# Patient Record
Sex: Female | Born: 1945 | Race: Black or African American | Hispanic: No | Marital: Single | Smoking: Never smoker
Health system: Southern US, Community
[De-identification: ages and names within clinical notes are randomized; demographics above are authoritative.]

## PROBLEM LIST (undated history)

## (undated) DIAGNOSIS — I509 Heart failure, unspecified: Secondary | ICD-10-CM

## (undated) DIAGNOSIS — I4891 Unspecified atrial fibrillation: Secondary | ICD-10-CM

## (undated) DIAGNOSIS — E119 Type 2 diabetes mellitus without complications: Secondary | ICD-10-CM

## (undated) DIAGNOSIS — I2510 Atherosclerotic heart disease of native coronary artery without angina pectoris: Secondary | ICD-10-CM

## (undated) DIAGNOSIS — I10 Essential (primary) hypertension: Secondary | ICD-10-CM

## (undated) DIAGNOSIS — Z950 Presence of cardiac pacemaker: Secondary | ICD-10-CM

---

## 2011-11-11 HISTORY — PX: CARDIAC CATHETERIZATION: SHX172

## 2013-11-10 DIAGNOSIS — Z950 Presence of cardiac pacemaker: Secondary | ICD-10-CM

## 2013-11-10 HISTORY — DX: Presence of cardiac pacemaker: Z95.0

## 2017-01-10 ENCOUNTER — Encounter (HOSPITAL_COMMUNITY): Payer: Self-pay | Admitting: *Deleted

## 2017-01-10 ENCOUNTER — Observation Stay (HOSPITAL_COMMUNITY)
Admission: EM | Admit: 2017-01-10 | Discharge: 2017-01-10 | Disposition: A | Discharge status: Home / Self Care | Payer: Medicare Other | Attending: Family Medicine | Admitting: Family Medicine

## 2017-01-10 ENCOUNTER — Emergency Department (HOSPITAL_COMMUNITY): Payer: Medicare Other

## 2017-01-10 DIAGNOSIS — Z79899 Other long term (current) drug therapy: Secondary | ICD-10-CM | POA: Diagnosis not present

## 2017-01-10 DIAGNOSIS — I110 Hypertensive heart disease with heart failure: Secondary | ICD-10-CM | POA: Insufficient documentation

## 2017-01-10 DIAGNOSIS — M940 Chondrocostal junction syndrome [Tietze]: Secondary | ICD-10-CM | POA: Diagnosis present

## 2017-01-10 DIAGNOSIS — R079 Chest pain, unspecified: Secondary | ICD-10-CM | POA: Diagnosis not present

## 2017-01-10 DIAGNOSIS — H409 Unspecified glaucoma: Secondary | ICD-10-CM | POA: Diagnosis not present

## 2017-01-10 DIAGNOSIS — I2510 Atherosclerotic heart disease of native coronary artery without angina pectoris: Secondary | ICD-10-CM | POA: Diagnosis not present

## 2017-01-10 DIAGNOSIS — F17200 Nicotine dependence, unspecified, uncomplicated: Secondary | ICD-10-CM | POA: Insufficient documentation

## 2017-01-10 DIAGNOSIS — J45909 Unspecified asthma, uncomplicated: Secondary | ICD-10-CM | POA: Insufficient documentation

## 2017-01-10 DIAGNOSIS — Z7901 Long term (current) use of anticoagulants: Secondary | ICD-10-CM | POA: Insufficient documentation

## 2017-01-10 DIAGNOSIS — M7989 Other specified soft tissue disorders: Secondary | ICD-10-CM | POA: Insufficient documentation

## 2017-01-10 DIAGNOSIS — I509 Heart failure, unspecified: Secondary | ICD-10-CM | POA: Insufficient documentation

## 2017-01-10 DIAGNOSIS — R0600 Dyspnea, unspecified: Principal | ICD-10-CM | POA: Diagnosis present

## 2017-01-10 DIAGNOSIS — R7989 Other specified abnormal findings of blood chemistry: Secondary | ICD-10-CM | POA: Diagnosis not present

## 2017-01-10 DIAGNOSIS — E119 Type 2 diabetes mellitus without complications: Secondary | ICD-10-CM

## 2017-01-10 DIAGNOSIS — E785 Hyperlipidemia, unspecified: Secondary | ICD-10-CM | POA: Diagnosis not present

## 2017-01-10 DIAGNOSIS — I482 Chronic atrial fibrillation, unspecified: Secondary | ICD-10-CM

## 2017-01-10 DIAGNOSIS — I4891 Unspecified atrial fibrillation: Secondary | ICD-10-CM | POA: Diagnosis present

## 2017-01-10 DIAGNOSIS — Z950 Presence of cardiac pacemaker: Secondary | ICD-10-CM | POA: Diagnosis not present

## 2017-01-10 DIAGNOSIS — D649 Anemia, unspecified: Secondary | ICD-10-CM | POA: Insufficient documentation

## 2017-01-10 DIAGNOSIS — Z8701 Personal history of pneumonia (recurrent): Secondary | ICD-10-CM | POA: Diagnosis not present

## 2017-01-10 DIAGNOSIS — Z7982 Long term (current) use of aspirin: Secondary | ICD-10-CM | POA: Diagnosis not present

## 2017-01-10 DIAGNOSIS — Z7984 Long term (current) use of oral hypoglycemic drugs: Secondary | ICD-10-CM | POA: Insufficient documentation

## 2017-01-10 HISTORY — DX: Unspecified atrial fibrillation: I48.91

## 2017-01-10 HISTORY — DX: Type 2 diabetes mellitus without complications: E11.9

## 2017-01-10 HISTORY — DX: Atherosclerotic heart disease of native coronary artery without angina pectoris: I25.10

## 2017-01-10 HISTORY — DX: Heart failure, unspecified: I50.9

## 2017-01-10 HISTORY — DX: Presence of cardiac pacemaker: Z95.0

## 2017-01-10 LAB — BASIC METABOLIC PANEL
ANION GAP: 14 (ref 5–15)
BUN: 20 mg/dL (ref 6–20)
CALCIUM: 9.8 mg/dL (ref 8.9–10.3)
CO2: 23 mmol/L (ref 22–32)
Chloride: 102 mmol/L (ref 101–111)
Creatinine, Ser: 1.18 mg/dL — ABNORMAL HIGH (ref 0.44–1.00)
GFR, EST AFRICAN AMERICAN: 54 mL/min — AB (ref >60)
GFR, EST NON AFRICAN AMERICAN: 46 mL/min — AB (ref >60)
GLUCOSE: 169 mg/dL — AB (ref 65–99)
POTASSIUM: 4.2 mmol/L (ref 3.5–5.1)
Sodium: 139 mmol/L (ref 135–145)

## 2017-01-10 LAB — DIFFERENTIAL
BASOS ABS: 0 10*3/uL (ref 0.0–0.1)
BASOS PCT: 0 %
EOS ABS: 0.3 10*3/uL (ref 0.0–0.7)
Eosinophils Relative: 4 %
Lymphocytes Relative: 25 %
Lymphs Abs: 1.8 10*3/uL (ref 0.7–4.0)
Monocytes Absolute: 0.5 10*3/uL (ref 0.1–1.0)
Monocytes Relative: 6 %
NEUTROS ABS: 4.7 10*3/uL (ref 1.7–7.7)
NEUTROS PCT: 65 %

## 2017-01-10 LAB — CBC
HEMATOCRIT: 33.1 % — AB (ref 36.0–46.0)
HEMOGLOBIN: 11.1 g/dL — AB (ref 12.0–15.0)
MCH: 28.8 pg (ref 26.0–34.0)
MCHC: 33.5 g/dL (ref 30.0–36.0)
MCV: 85.8 fL (ref 78.0–100.0)
Platelets: 436 10*3/uL — ABNORMAL HIGH (ref 150–400)
RBC: 3.86 MIL/uL — AB (ref 3.87–5.11)
RDW: 15.8 % — ABNORMAL HIGH (ref 11.5–15.5)
WBC: 7.8 10*3/uL (ref 4.0–10.5)

## 2017-01-10 LAB — I-STAT TROPONIN, ED: TROPONIN I, POC: 0 ng/mL (ref 0.00–0.08)

## 2017-01-10 LAB — PROTIME-INR
INR: 1.95
PROTHROMBIN TIME: 22.6 s — AB (ref 11.4–15.2)

## 2017-01-10 LAB — TROPONIN I: Troponin I: <0.03 ng/mL (ref <0.03)

## 2017-01-10 LAB — BRAIN NATRIURETIC PEPTIDE: B NATRIURETIC PEPTIDE 5: 348.7 pg/mL — AB (ref 0.0–100.0)

## 2017-01-10 MED ORDER — FUROSEMIDE 10 MG/ML IJ SOLN
40.0000 mg | Freq: Once | INTRAMUSCULAR | Status: AC
  Administered 2017-01-10: 40 mg via INTRAVENOUS
  Filled 2017-01-10: qty 4

## 2017-01-10 MED ORDER — METHYLPREDNISOLONE SODIUM SUCC 125 MG IJ SOLR
125.0000 mg | Freq: Two times a day (BID) | INTRAMUSCULAR | Status: DC
  Administered 2017-01-10: 125 mg via INTRAVENOUS
  Filled 2017-01-10: qty 2

## 2017-01-10 MED ORDER — LATANOPROST 0.005 % OP SOLN: 1.0000 [drp] | Freq: Every day | OPHTHALMIC | Status: DC

## 2017-01-10 MED ORDER — GUAIFENESIN ER 600 MG PO TB12: 600.0000 mg | ORAL_TABLET | Freq: Two times a day (BID) | ORAL | 0 refills | Status: AC

## 2017-01-10 MED ORDER — LISINOPRIL 20 MG PO TABS
20.0000 mg | ORAL_TABLET | Freq: Two times a day (BID) | ORAL | Status: DC
  Filled 2017-01-10: qty 1

## 2017-01-10 MED ORDER — HYDROCODONE-ACETAMINOPHEN 5-325 MG PO TABS: 1.0000 | ORAL_TABLET | Freq: Four times a day (QID) | ORAL | 0 refills | Status: DC | PRN

## 2017-01-10 MED ORDER — ACETAMINOPHEN 325 MG PO TABS: 650.0000 mg | ORAL_TABLET | ORAL | Status: DC | PRN

## 2017-01-10 MED ORDER — METOPROLOL TARTRATE 25 MG PO TABS: 125.0000 mg | ORAL_TABLET | Freq: Two times a day (BID) | ORAL | Status: DC

## 2017-01-10 MED ORDER — GUAIFENESIN ER 600 MG PO TB12: 600.0000 mg | ORAL_TABLET | Freq: Two times a day (BID) | ORAL | Status: DC

## 2017-01-10 MED ORDER — HYDROMORPHONE HCL 2 MG/ML IJ SOLN
0.5000 mg | Freq: Once | INTRAMUSCULAR | Status: AC | PRN
  Administered 2017-01-10: 0.5 mg via INTRAVENOUS
  Filled 2017-01-10: qty 1

## 2017-01-10 MED ORDER — AZITHROMYCIN 500 MG PO TABS: 500.0000 mg | ORAL_TABLET | Freq: Every day | ORAL | 0 refills | Status: AC

## 2017-01-10 MED ORDER — ONDANSETRON HCL 4 MG/2ML IJ SOLN: 4.0000 mg | Freq: Four times a day (QID) | INTRAMUSCULAR | Status: DC | PRN

## 2017-01-10 MED ORDER — ASPIRIN 325 MG PO TABS
325.0000 mg | ORAL_TABLET | Freq: Every day | ORAL | Status: DC
  Filled 2017-01-10: qty 1

## 2017-01-10 MED ORDER — KETOROLAC TROMETHAMINE 30 MG/ML IJ SOLN
15.0000 mg | Freq: Once | INTRAMUSCULAR | Status: AC
  Administered 2017-01-10: 15 mg via INTRAVENOUS
  Filled 2017-01-10: qty 1

## 2017-01-10 MED ORDER — ATORVASTATIN CALCIUM 80 MG PO TABS: 80.0000 mg | ORAL_TABLET | Freq: Every day | ORAL | Status: DC

## 2017-01-10 MED ORDER — MORPHINE SULFATE (PF) 4 MG/ML IV SOLN: 2.0000 mg | INTRAVENOUS | Status: DC | PRN

## 2017-01-10 MED ORDER — ZOLPIDEM TARTRATE 5 MG PO TABS: 5.0000 mg | ORAL_TABLET | Freq: Every evening | ORAL | Status: DC | PRN

## 2017-01-10 MED ORDER — FUROSEMIDE 20 MG PO TABS: 20.0000 mg | ORAL_TABLET | Freq: Every day | ORAL | Status: DC

## 2017-01-10 MED ORDER — IPRATROPIUM-ALBUTEROL 0.5-2.5 (3) MG/3ML IN SOLN
3.0000 mL | Freq: Four times a day (QID) | RESPIRATORY_TRACT | Status: DC
  Administered 2017-01-10: 3 mL via RESPIRATORY_TRACT
  Filled 2017-01-10 (×3): qty 3

## 2017-01-10 MED ORDER — ALBUTEROL SULFATE HFA 108 (90 BASE) MCG/ACT IN AERS: 2.0000 | INHALATION_SPRAY | RESPIRATORY_TRACT | 0 refills | Status: AC | PRN

## 2017-01-10 MED ORDER — PREDNISONE 20 MG PO TABS: 40.0000 mg | ORAL_TABLET | Freq: Every day | ORAL | 0 refills | Status: AC

## 2017-01-10 MED ORDER — ENOXAPARIN SODIUM 40 MG/0.4ML ~~LOC~~ SOLN: 40.0000 mg | SUBCUTANEOUS | Status: DC

## 2017-01-10 MED ORDER — HYDRALAZINE HCL 20 MG/ML IJ SOLN: 10.0000 mg | Freq: Three times a day (TID) | INTRAMUSCULAR | Status: DC | PRN

## 2017-01-10 MED ORDER — INSULIN ASPART 100 UNIT/ML ~~LOC~~ SOLN: 0.0000 [IU] | Freq: Three times a day (TID) | SUBCUTANEOUS | Status: DC

## 2017-01-10 NOTE — ED Notes (Signed)
Walked patient to the bathroom patient did well stayed at 96 room air and went down as low as 92 room air

## 2017-01-10 NOTE — Discharge Instructions (Signed)
Take 77m of lasix tonight Take 437mdaily for the following two days Make sure to take a breathing treatment at least every 6 hours Follow-up with your heart doctor next Thursday as scheduled

## 2017-01-10 NOTE — H&P (Signed)
[facility] History and Physical  Kyri Shader XYI:[id number] DOB: [birth year] DOA: 01/10/2017  Referring physician:  PCP: No primary care provider on file.   Chief Complaint: "I feel short of breath."  HPI: Candice Mclean is a 71 y.o. female  as well as history of atrial fibrillation, coronary disease, diabetes who presents to the hospital with chief complaint shortness of breath. Though patient was recently discharged from-Hospital with a diagnosis of arm infection and pneumonia. Vision since a few days after leaving the hospital she began to fell short of breath. Patient with a cough. Cough productive. Patient denies any fevers, chills, nausea vomiting, diarrhea or rash. Patient states while in hospital she developed some swelling in her extremities. Patient had  echo that was normal at the OSH hospital. She took Lasix regularly prior to coming to the hospital and was discharged on Lasix. She was compliant with her medication.  [place]: Patient given Lasix and Toradol. Chest x-ray showed possible fluid. Hospitalist consult for admission.  Review of Systems:  As per HPI otherwise 10 point review of systems negative.    Past Medical History:  Diagnosis Date  . AF (atrial fibrillation) ([place])   . CHF (congestive heart failure) ([place])   . Coronary artery disease   . Diabetes mellitus without complication ([place])   . Pacemaker    History reviewed. No pertinent surgical history. Social History:  reports that she has been smoking.  She has never used smokeless tobacco. She reports that she does not drink alcohol. Her drug history is not on file.  Allergies  Allergen Reactions  . Versed [Midazolam] Hives  . Tylenol With Codeine #3 [Acetaminophen-Codeine] Palpitations    No family history on file.   Prior to Admission medications   Medication Sig Start Date End Date Taking? Authorizing Provider  albuterol (PROVENTIL HFA;VENTOLIN HFA) 108 (90 Base) MCG/ACT inhaler Inhale 1-2 puffs  into the lungs every 6 (six) hours as needed for wheezing or shortness of breath.   Yes Historical Provider, MD  aspirin EC 81 MG tablet Take 81 mg by mouth daily.   Yes Historical Provider, MD  atorvastatin (LIPITOR) 80 MG tablet Take 80 mg by mouth daily.   Yes Historical Provider, MD  doxepin (SINEQUAN) 10 MG capsule Take 10 mg by mouth 3 (three) times a week. Monday, Wednesday and Saturday   Yes Historical Provider, MD  furosemide (LASIX) 20 MG tablet Take 20 mg by mouth daily.   Yes Historical Provider, MD  latanoprost (XALATAN) 0.005 % ophthalmic solution Place 1 drop into both eyes at bedtime.   Yes Historical Provider, MD  lisinopril (PRINIVIL,ZESTRIL) 20 MG tablet Take 20 mg by mouth 2 (two) times daily.   Yes Historical Provider, MD  magnesium oxide (MAG-OX) 400 (241.3 Mg) MG tablet Take 400-800 mg by mouth 2 (two) times daily. Two tablets in the morning and 1 tablet in the evening   Yes Historical Provider, MD  metFORMIN (GLUCOPHAGE) 500 MG tablet Take 1,000 mg by mouth daily with breakfast.   Yes Historical Provider, MD  metoprolol tartrate (LOPRESSOR) 25 MG tablet Take 125 mg by mouth 2 (two) times daily.   Yes Historical Provider, MD  montelukast (SINGULAIR) 10 MG tablet Take 10 mg by mouth at bedtime.   Yes Historical Provider, MD  Potassium 99 MG TABS Take 1 tablet by mouth daily.   Yes Historical Provider, MD  warfarin (COUMADIN) 5 MG tablet Take 5 mg by mouth daily. Take 1 tablet (79m) daily except Thursday take 1.5  tablets  (7.39m).   Yes Historical Provider, MD   Physical Exam: Vitals:   01/10/17 0436 01/10/17 0530 01/10/17 0645 01/10/17 0715  BP: 164/88 150/93 (!) 190/117 (!) 175/114  Pulse: 63 78 75 74  Resp: _0 Temp: 98.4 F (36.9 C)     TempSrc: Oral     SpO2: 99% 98% 96% 97%    Wt Readings from Last 3 Encounters:  No data found for Wt    General:  Appears calm and comfortable, Alert and oriented 3 Eyes:  PERRL, EOMI, normal lids, iris ENT:  grossly  normal hearing, lips & tongue Neck:  no LAD, masses or thyromegaly Cardiovascular:  Regular irregular, no m/r/g. No LE edema.  Respiratory:  Decreased air movement, tachypnea, no rales or rhonchi, chest wall tenderness on the right to palpation Abdomen:  soft, ntnd Skin:  no rash or induration seen on limited exam Musculoskeletal:  grossly normal tone BUE/BLE; limited range of motion of right hand to make a fist Psychiatric:  grossly normal mood and affect, speech fluent and appropriate Neurologic:  CN 2-12 grossly intact, moves all extremities in coordinated fashion.          Labs on Admission:  Basic Metabolic Panel:  Recent Labs Lab 01/10/17 0442  NA 139  K 4.2  CL 102  CO2 23  GLUCOSE 169*  BUN 20  CREATININE 1.18*  CALCIUM 9.8   Liver Function Tests: No results for input(s): AST, ALT, ALKPHOS, BILITOT, PROT, ALBUMIN in the last 168 hours. No results for input(s): LIPASE, AMYLASE in the last 168 hours. No results for input(s): AMMONIA in the last 168 hours. CBC:  Recent Labs Lab 01/10/17 0442  WBC 7.8  NEUTROABS 4.7  HGB 11.1*  HCT 33.1*  MCV 85.8  PLT 436*   Cardiac Enzymes:  Recent Labs Lab 01/10/17 0442  TROPONINI <0.03    BNP (last 3 results)  Recent Labs  01/10/17 0442  BNP 348.7*    ProBNP (last 3 results) No results for input(s): PROBNP in the last 8760 hours.   Creatinine clearance cannot be calculated (Unknown ideal weight.)  CBG: No results for input(s): GLUCAP in the last 168 hours.  Radiological Exams on Admission: Dg Chest 2 View  Result Date: 01/10/2017 CLINICAL DATA:  Acute onset of shortness of breath. Initial encounter. EXAM: CHEST  2 VIEW COMPARISON:  None. FINDINGS: The lungs are well-aerated. Vascular congestion is noted. Increased interstitial markings raise concern for mild pulmonary edema. There is no evidence of pleural effusion or pneumothorax. The heart is mildly enlarged. A pacemaker is noted at the left chest wall,  with leads ending at the right atrium and right ventricle. No acute osseous abnormalities are seen. IMPRESSION: Vascular congestion and mild cardiomegaly. Increased interstitial markings raise concern for mild pulmonary edema. Electronically Signed   By: JGarald BaldingM.D.   On: 01/10/2017 06:01    EKG: Independently reviewed. Afib, no acute St changes  Assessment/Plan Principal Problem:   Dyspnea Active Problems:   Diabetes (HCC)   Chest pain   Costochondritis   A-fib (HCC)   Dyspnea Wells score for PE of 0 Elevated BNP Chest x-ray read as possible fluid overload consider possible pneumonitis on exam little air movement These headaches were scheduled with ipsilateral. Epidural Steroids due to history of asthma and recent diagnosis of pneumonia Medical records ordered Nurses to ambulate patient  CP/costochondritis Given hear score of 3 in ED. - serial trop ordered, initial neg - prn  EKG CP - prn moprhine CP - prn ntg cp - asa in ED and QD - ECHO ordered for AM - tele bed, cardiac monitoring - ambien for sleep prn - zofran prn for nausea  DM SSI Hold metformin  Afib/CAD Cont lopressor 173m bid Cont coumadin  Hyperlipidemia Continue statin  Leg Swelling Cont lasix PO tomorrow  Hypertension When necessary hydralazine 10 mg IV as needed for severe blood pressure Cont lisinopril  Glaucoma Xalatan  Denies hx of CHF   Code Status: FULL DVT Prophylaxis: Lovenox Family Communication: Dgtr at bedside Disposition Plan: Pending Improvement  Status: Tele obs  PElwin Mocha MD Family Medicine [facility] [email or web address] Password [email or web address]

## 2017-01-10 NOTE — ED Notes (Signed)
Per Dr. Aggie Moats, patient being discharged.

## 2017-01-10 NOTE — ED Notes (Signed)
Pt transported to xray 

## 2017-01-10 NOTE — ED Triage Notes (Signed)
The pt is c/o shortness of breath since last week  She was discharged from a hospital in [place] on Monday.  She is here  Staying with her daughter.  Recent diagnosis of pneumonia.  She also has rt arm pain past infection there

## 2017-01-10 NOTE — ED Notes (Signed)
Pts daughter has question about Prednisone prescription.  Paged Dr. Aggie Moats, he will call prescription to pts pharmacy.  Advised family.

## 2017-01-10 NOTE — ED Notes (Signed)
Pt does not want to be admitted if she doesn't have to.  Declining meds unless she gets admitted.

## 2017-01-10 NOTE — ED Provider Notes (Signed)
[facility] DEPT Provider Note   CSN: [id number] Arrival date & time: 01/10/17  0424     History   Chief Complaint Chief Complaint  Patient presents with  . Shortness of Breath    HPI Candice Mclean is a 71 y.o. female.  She was discharged from a hospital in [facility] 5 days ago where she was admitted for pneumonia. She was also treated for fluid overload with furosemide. She is complaining of a throbbing pain in the right lower rib cage which has been present for the bout the last week. She rates pain at 7/10. It is worse with deep breathing. She has noted ongoing problems with dyspnea. There is been a very mild cough which is nonproductive. She denies fever, chills, sweats. She denies nausea or vomiting. She was not discharged with any pain medication, so she has not been taking anything stronger than acetaminophen.   The history is provided by the patient.    Past Medical History:  Diagnosis Date  . AF (atrial fibrillation) ([place])   . CHF (congestive heart failure) ([place])   . Coronary artery disease   . Diabetes mellitus without complication ([place])   . Pacemaker     There are no active problems to display for this patient.   History reviewed. No pertinent surgical history.  OB History    No data available       Home Medications    Prior to Admission medications   Not on File    Family History No family history on file.  Social History Social History  Substance Use Topics  . Smoking status: Current Every Day Smoker  . Smokeless tobacco: Never Used  . Alcohol use No     Allergies   Patient has no allergy information on record.   Review of Systems Review of Systems  All other systems reviewed and are negative.    Physical Exam Updated Vital Signs BP 164/88 (BP Location: Left Arm)   Pulse 63   Temp 98.4 F (36.9 C) (Oral)   Resp 22   SpO2 99%   Physical Exam  Nursing note and vitals reviewed.  71 year old female, Appears somewhat  dyspneic, but is in no acute distress. Vital signs are significant for tachypnea and hypertension. Oxygen saturation is 99%, which is normal. Head is normocephalic and atraumatic. PERRLA, EOMI. Oropharynx is clear. Neck is nontender and supple without adenopathy or JVD. Back is nontender and there is no CVA tenderness. Lungs are clear without rales, wheezes, or rhonchi. Chest has mild to moderate tenderness in the right lower rib cage. Heart has regular rate and rhythm without murmur. Abdomen is soft, flat, nontender without masses or hepatosplenomegaly and peristalsis is normoactive. Extremities have 1+ edema, full range of motion is present. Skin is warm and dry without rash. Neurologic: Mental status is normal, cranial nerves are intact, there are no motor or sensory deficits.  ED Treatments / Results  Labs (all labs ordered are listed, but only abnormal results are displayed) Labs Reviewed  BASIC METABOLIC PANEL - Abnormal; Notable for the following:       Result Value   Glucose, Bld 169 (*)    Creatinine, Ser 1.18 (*)    GFR calc non Af Amer 46 (*)    GFR calc Af Amer 54 (*)    All other components within normal limits  CBC - Abnormal; Notable for the following:    RBC 3.86 (*)    Hemoglobin 11.1 (*)  HCT 33.1 (*)    RDW 15.8 (*)    Platelets 436 (*)    All other components within normal limits  PROTIME-INR - Abnormal; Notable for the following:    Prothrombin Time 22.6 (*)    All other components within normal limits  BRAIN NATRIURETIC PEPTIDE - Abnormal; Notable for the following:    B Natriuretic Peptide 348.7 (*)    All other components within normal limits  TROPONIN I  DIFFERENTIAL  I-STAT TROPOININ, ED    EKG  EKG Interpretation  Date/Time:  Saturday January 10 2017 04:41:25 EST Ventricular Rate:  101 PR Interval:    QRS Duration: 74 QT Interval:  352 QTC Calculation: 456 R Axis:   29 Text Interpretation:  Atrial fibrillation with rapid ventricular  response with occasional ventricular-paced complexes and with premature ventricular or aberrantly conducted complexes Nonspecific T wave abnormality Abnormal ECG No old tracing to compare Confirmed by [facility]  MD, Blythe Veach ([id number]) on 01/10/2017 4:39:44 AM       Radiology Dg Chest 2 View  Result Date: 01/10/2017 CLINICAL DATA:  Acute onset of shortness of breath. Initial encounter. EXAM: CHEST  2 VIEW COMPARISON:  None. FINDINGS: The lungs are well-aerated. Vascular congestion is noted. Increased interstitial markings raise concern for mild pulmonary edema. There is no evidence of pleural effusion or pneumothorax. The heart is mildly enlarged. A pacemaker is noted at the left chest wall, with leads ending at the right atrium and right ventricle. No acute osseous abnormalities are seen. IMPRESSION: Vascular congestion and mild cardiomegaly. Increased interstitial markings raise concern for mild pulmonary edema. Electronically Signed   By: Garald Balding M.D.   On: 01/10/2017 06:01    Procedures Procedures (including critical care time)  Medications Ordered in ED Medications  ketorolac (TORADOL) 30 MG/ML injection 15 mg (not administered)     Initial Impression / Assessment and Plan / ED Course  I have reviewed the triage vital signs and the nursing notes.  Pertinent labs & imaging results that were available during my care of the patient were reviewed by me and considered in my medical decision making (see chart for details).  Dyspnea and chest pain in patient with the recent history of pneumonia. We'll check chest x-ray and also obtain a screening labs including BNP. No evidence of bronchospasm on exam, so she will not be given nebulizer treatments. She has no old records and the [facility] system.  Chest x-ray shows evidence of pulmonary vascular congestion and early pulmonary edema. BNP is significantly elevated. INR is minimally subtherapeutic. She is given a dose of furosemide but continues to  be dyspneic at rest. Cases discussed with Dr. Aggie Moats of [facility] who states he will come to evaluate the patient for possible admission.  CHA2DS2/VAS Stroke Risk Score = 5  (one point each for age, female sex, CHF history, Vascular disease history, diabetes history)     Final Clinical Impressions(s) / ED Diagnoses   Final diagnoses:  Acute on chronic congestive heart failure, unspecified congestive heart failure type ([place])  Atrial fibrillation, chronic (HCC)  Normochromic normocytic anemia    New Prescriptions New Prescriptions   No medications on file     Delora Fuel, MD 83/33/83 [id number]

## 2017-01-12 ENCOUNTER — Telehealth (HOSPITAL_COMMUNITY): Payer: Self-pay | Admitting: *Deleted

## 2017-01-12 NOTE — Telephone Encounter (Signed)
Pt on afib ED report.  Pt sees [facility] healthcare cardiologist and per note already has scheduled appt with their office.  Will not follow at this time.

## 2017-03-20 NOTE — Discharge Summary (Signed)
Physician Discharge Summary  Candice Mclean IWO:[id number] DOB: 1946/02/27 DOA: 01/10/2017  PCP: No primary care provider on file.  Admit date: 01/10/2017 Discharge date: 03/20/2017  Time spent: 30 minutes  Recommendations for Outpatient Follow-up:  1. f/u with PCP tomorrow if possible   Discharge Diagnoses:  Principal Problem:   Dyspnea Active Problems:   Diabetes ([place])   Chest pain   Costochondritis   A-fib ([place])   Discharge Condition: stable  Diet recommendation: Cardiac/DM    History of present illness:  Candice Mclean is a 71 y.o. female  as well as history of atrial fibrillation, coronary disease, diabetes who presents to the hospital with chief complaint shortness of breath. Though patient was recently discharged from-Hospital with a diagnosis of arm infection and pneumonia. Vision since a few days after leaving the hospital she began to fell short of breath. Patient with a cough. Cough productive. Patient denies any fevers, chills, nausea vomiting, diarrhea or rash. Patient states while in hospital she developed some swelling in her extremities. Patient had  echo that was normal at the OSH hospital. She took Lasix regularly prior to coming to the hospital and was discharged on Lasix. She was compliant with her medication.  [place]: Patient given Lasix and Toradol. Chest x-ray showed possible fluid. Hospitalist consult for admission.  Hospital Course:  While in ED. Pt anxious about admission. Better with a breathing treatment. Based on hx possible OP tx failure due to abx regimen and lack of resp meds. Pt [place]. Family and patient seem reliable. Pt was to return if any issues. Pt d/c from ED.    Discharge Exam: Vitals:   01/10/17 1200 01/10/17 1226  BP: 142/100   Pulse: 81   Resp: 18   Temp:  98.2 F (36.8 C)    General: NAD, A&Ox3 Cardiovascular: RRR, no MRG Respiratory: nl wob  Discharge Instructions   Discharge  Instructions    Call MD for:  difficulty breathing, headache or visual disturbances    Complete by:  As directed    Call MD for:  redness, tenderness, or signs of infection (pain, swelling, redness, odor or green/yellow discharge around incision site)    Complete by:  As directed    Diet - low sodium heart healthy    Complete by:  As directed    Diet - low sodium heart healthy    Complete by:  As directed    Discharge instructions    Complete by:  As directed    Print prepared   Driving Restrictions    Complete by:  As directed    7 days   Increase activity slowly    Complete by:  As directed    Increase activity slowly    Complete by:  As directed    Sexual Activity Restrictions    Complete by:  As directed    7 days     Discharge Medication List as of 01/10/2017 12:18 PM    START taking these medications   Details  azithromycin (ZITHROMAX) 500 MG tablet Take 1 tablet (500 mg total) by mouth daily., Starting Sat 01/10/2017, Until Thu 01/15/2017, Normal    guaiFENesin (MUCINEX) 600 MG 12 hr tablet Take 1 tablet (600 mg total) by mouth 2 (two) times daily., Starting Sat 01/10/2017, Until Sat 01/17/2017, Normal    HYDROcodone-acetaminophen (NORCO/VICODIN) 5-325 MG tablet Take 1 tablet by mouth every 6 (six) hours as needed for moderate pain., Starting Sat 01/10/2017, Print  CONTINUE these medications which have CHANGED   Details  albuterol (PROVENTIL HFA;VENTOLIN HFA) 108 (90 Base) MCG/ACT inhaler Inhale 2 puffs into the lungs every 2 (two) hours as needed for wheezing or shortness of breath., Starting Sat 01/10/2017, Normal      CONTINUE these medications which have NOT CHANGED   Details  aspirin EC 81 MG tablet Take 81 mg by mouth daily., Historical Med    atorvastatin (LIPITOR) 80 MG tablet Take 80 mg by mouth daily., Historical Med    doxepin (SINEQUAN) 10 MG capsule Take 10 mg by mouth 3 (three) times a week. Monday, Wednesday and Saturday, Historical Med    furosemide  (LASIX) 20 MG tablet Take 20 mg by mouth daily., Historical Med    latanoprost (XALATAN) 0.005 % ophthalmic solution Place 1 drop into both eyes at bedtime., Historical Med    lisinopril (PRINIVIL,ZESTRIL) 20 MG tablet Take 20 mg by mouth 2 (two) times daily., Historical Med    magnesium oxide (MAG-OX) 400 (241.3 Mg) MG tablet Take 400-800 mg by mouth 2 (two) times daily. Two tablets in the morning and 1 tablet in the evening, Historical Med    metFORMIN (GLUCOPHAGE) 500 MG tablet Take 1,000 mg by mouth daily with breakfast., Historical Med    metoprolol tartrate (LOPRESSOR) 25 MG tablet Take 125 mg by mouth 2 (two) times daily., Historical Med    montelukast (SINGULAIR) 10 MG tablet Take 10 mg by mouth at bedtime., Historical Med    warfarin (COUMADIN) 5 MG tablet Take 5 mg by mouth daily. Take 1 tablet (55m) daily except Thursday take 1.5 tablets  (7.520m., Historical Med      STOP taking these medications     Potassium 99 MG TABS        Allergies  Allergen Reactions  . Versed [Midazolam] Hives  . Tylenol With Codeine #3 [Acetaminophen-Codeine] Palpitations      The results of significant diagnostics from this hospitalization (including imaging, microbiology, ancillary and laboratory) are listed below for reference.    Significant Diagnostic Studies: No results found.  Microbiology: No results found for this or any previous visit (from the past 240 hour(s)).   Labs: Basic Metabolic Panel: No results for input(s): NA, K, CL, CO2, GLUCOSE, BUN, CREATININE, CALCIUM, MG, PHOS in the last 168 hours. Liver Function Tests: No results for input(s): AST, ALT, ALKPHOS, BILITOT, PROT, ALBUMIN in the last 168 hours. No results for input(s): LIPASE, AMYLASE in the last 168 hours. No results for input(s): AMMONIA in the last 168 hours. CBC: No results for input(s): WBC, NEUTROABS, HGB, HCT, MCV, PLT in the last 168 hours. Cardiac Enzymes: No results for input(s): CKTOTAL, CKMB,  CKMBINDEX, TROPONINI in the last 168 hours. BNP: BNP (last 3 results)  Recent Labs  01/10/17 0442  BNP 348.7*    ProBNP (last 3 results) No results for input(s): PROBNP in the last 8760 hours.  CBG: No results for input(s): GLUCAP in the last 168 hours.     Signed:  PhElwin MochaD  FACP  [facility] 03/20/2017, 10:22 AM

## 2018-05-07 IMAGING — DX DG CHEST 2V
2 series · 2 of 2 positions shown · non-contrast
Comparison: None.

CLINICAL DATA: Acute onset of shortness of breath. Initial
encounter.

EXAM:
CHEST  2 VIEW

[chest lat]
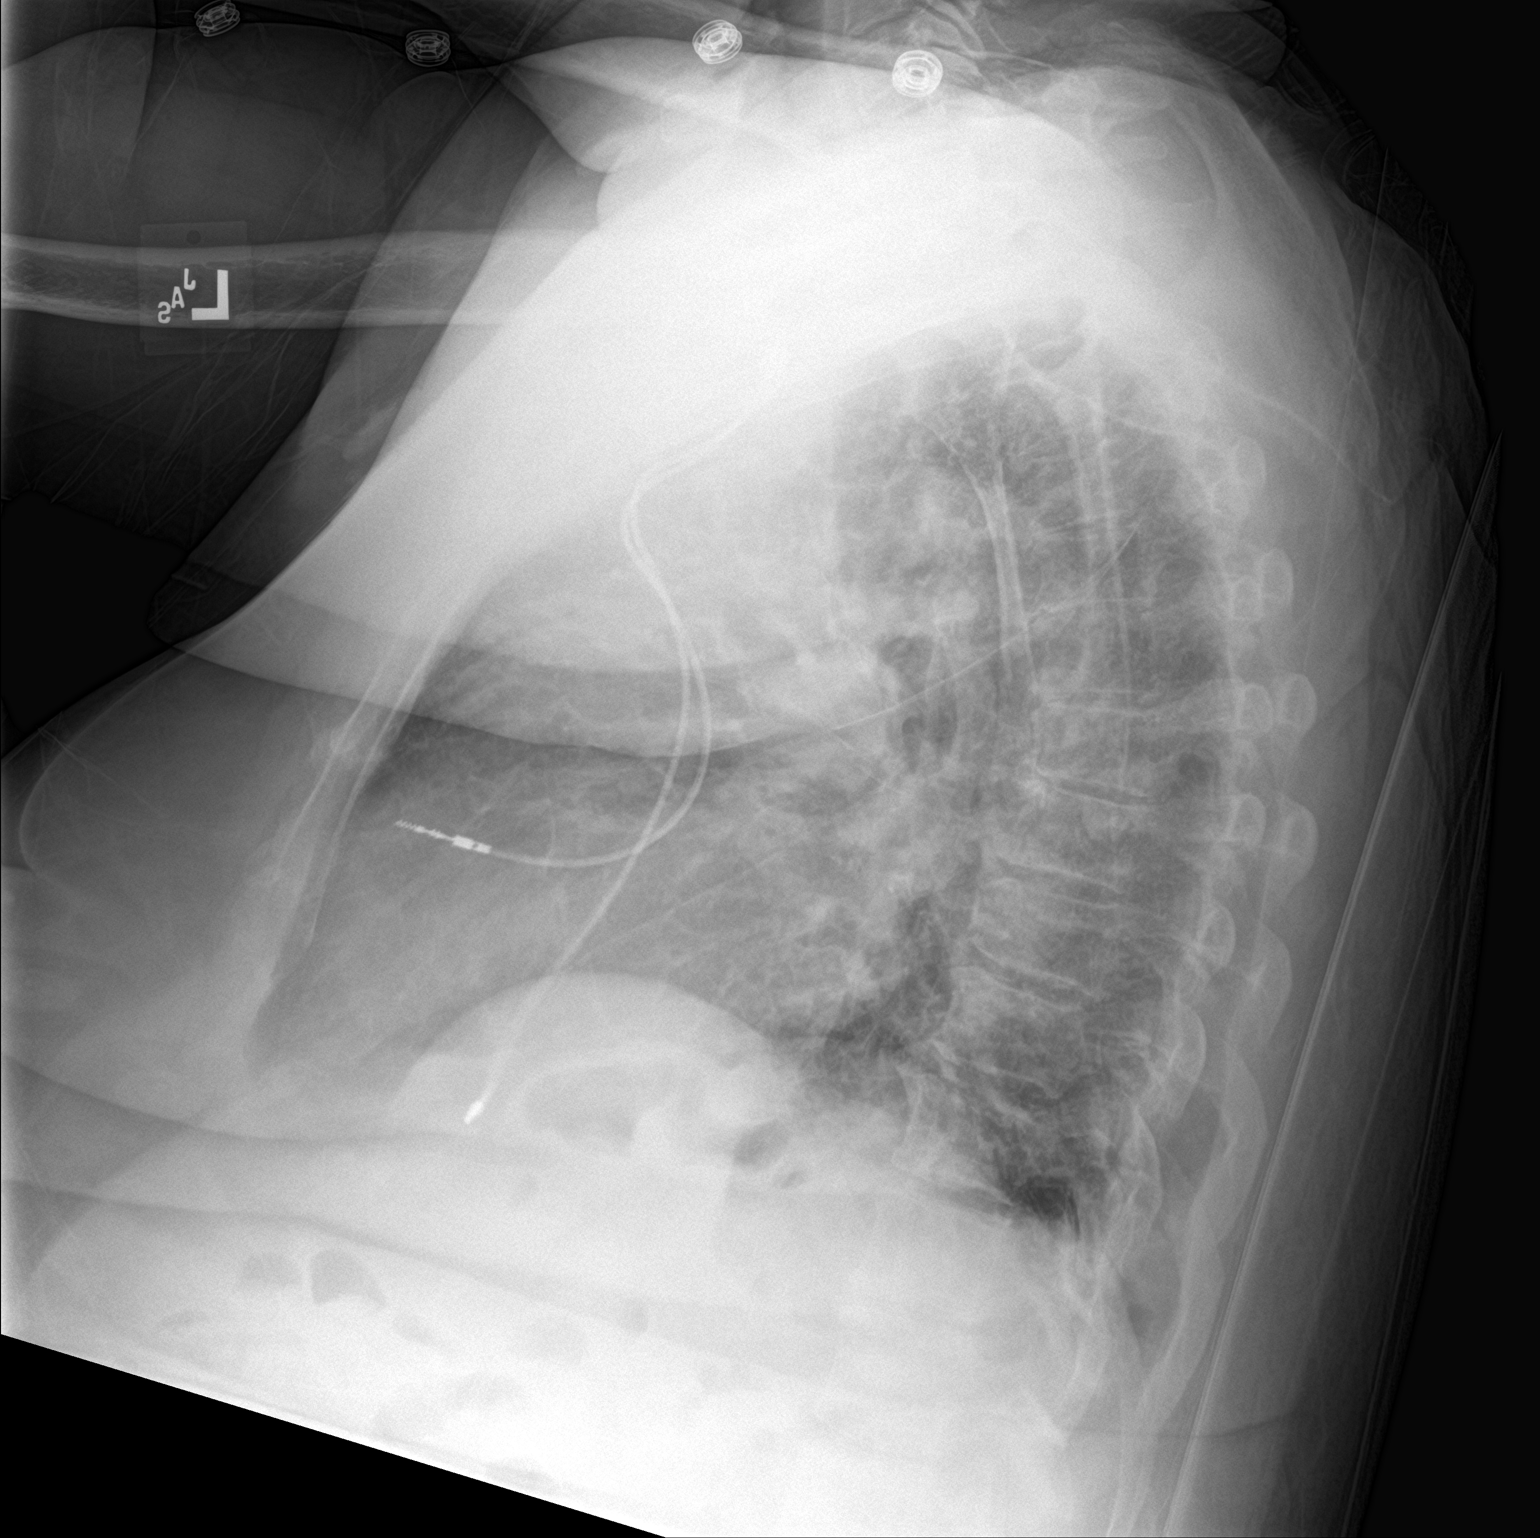

[chest ap]
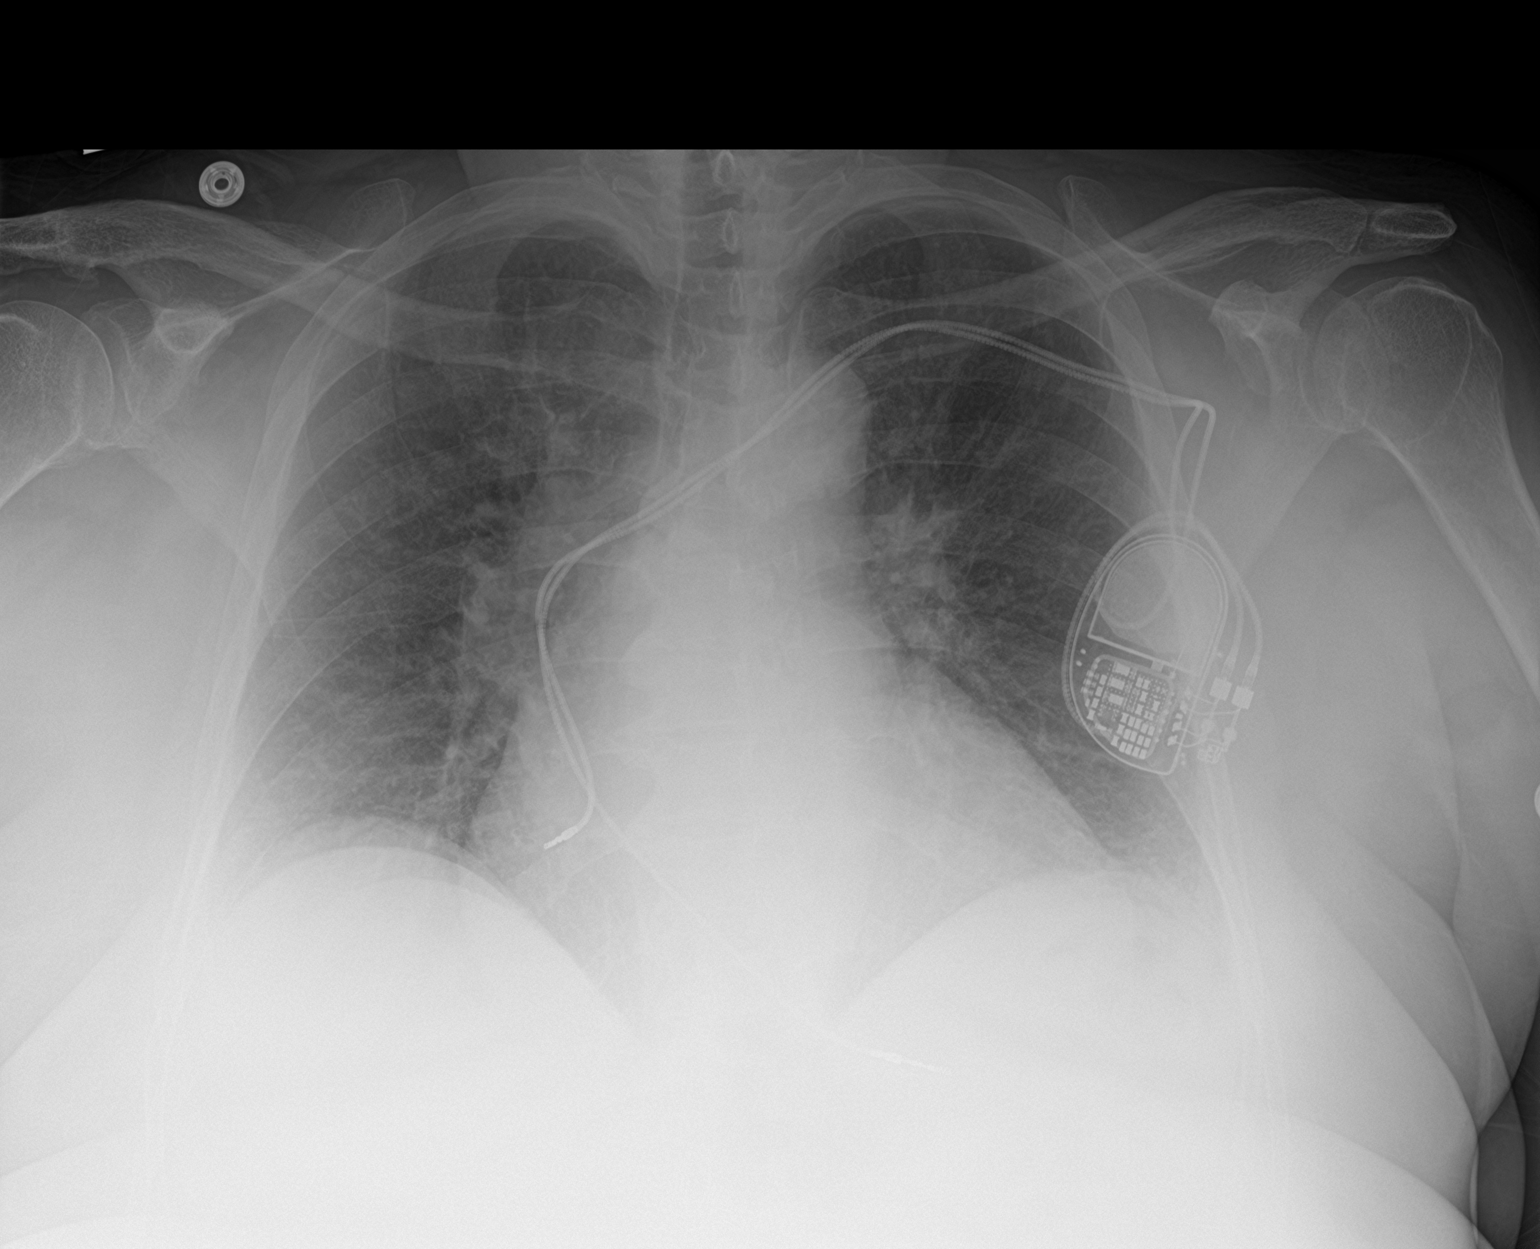

[2 of 2 positions shown; findings below may reference images not displayed]

FINDINGS: The lungs are well-aerated. Vascular congestion is noted. Increased
interstitial markings raise concern for mild pulmonary edema. There
is no evidence of pleural effusion or pneumothorax.

The heart is mildly enlarged. A pacemaker is noted at the left chest
wall, with leads ending at the right atrium and right ventricle. No
acute osseous abnormalities are seen.
IMPRESSION: Vascular congestion and mild cardiomegaly. Increased interstitial
markings raise concern for mild pulmonary edema.

## 2022-08-06 ENCOUNTER — Emergency Department (HOSPITAL_COMMUNITY): Payer: Medicare Other

## 2022-08-06 ENCOUNTER — Encounter (HOSPITAL_COMMUNITY): Payer: Self-pay | Admitting: Emergency Medicine

## 2022-08-06 ENCOUNTER — Inpatient Hospital Stay (HOSPITAL_COMMUNITY)
Admission: EM | Admit: 2022-08-06 | Discharge: 2022-08-18 | DRG: 193 | Disposition: A | Discharge status: Skilled Nursing Facility | Payer: Medicare Other | Attending: Family Medicine | Admitting: Family Medicine

## 2022-08-06 DIAGNOSIS — I5033 Acute on chronic diastolic (congestive) heart failure: Secondary | ICD-10-CM | POA: Diagnosis present

## 2022-08-06 DIAGNOSIS — I2723 Pulmonary hypertension due to lung diseases and hypoxia: Secondary | ICD-10-CM | POA: Diagnosis present

## 2022-08-06 DIAGNOSIS — I509 Heart failure, unspecified: Secondary | ICD-10-CM

## 2022-08-06 DIAGNOSIS — E119 Type 2 diabetes mellitus without complications: Secondary | ICD-10-CM

## 2022-08-06 DIAGNOSIS — Z20822 Contact with and (suspected) exposure to covid-19: Secondary | ICD-10-CM | POA: Diagnosis present

## 2022-08-06 DIAGNOSIS — I495 Sick sinus syndrome: Secondary | ICD-10-CM | POA: Diagnosis present

## 2022-08-06 DIAGNOSIS — J1289 Other viral pneumonia: Secondary | ICD-10-CM | POA: Diagnosis not present

## 2022-08-06 DIAGNOSIS — Z885 Allergy status to narcotic agent status: Secondary | ICD-10-CM

## 2022-08-06 DIAGNOSIS — J4520 Mild intermittent asthma, uncomplicated: Secondary | ICD-10-CM | POA: Diagnosis present

## 2022-08-06 DIAGNOSIS — Z8249 Family history of ischemic heart disease and other diseases of the circulatory system: Secondary | ICD-10-CM

## 2022-08-06 DIAGNOSIS — B9789 Other viral agents as the cause of diseases classified elsewhere: Secondary | ICD-10-CM | POA: Diagnosis present

## 2022-08-06 DIAGNOSIS — R1110 Vomiting, unspecified: Secondary | ICD-10-CM | POA: Diagnosis present

## 2022-08-06 DIAGNOSIS — Z7984 Long term (current) use of oral hypoglycemic drugs: Secondary | ICD-10-CM

## 2022-08-06 DIAGNOSIS — J9811 Atelectasis: Secondary | ICD-10-CM | POA: Diagnosis present

## 2022-08-06 DIAGNOSIS — M79609 Pain in unspecified limb: Secondary | ICD-10-CM

## 2022-08-06 DIAGNOSIS — I270 Primary pulmonary hypertension: Secondary | ICD-10-CM

## 2022-08-06 DIAGNOSIS — R1310 Dysphagia, unspecified: Secondary | ICD-10-CM | POA: Diagnosis present

## 2022-08-06 DIAGNOSIS — I440 Atrioventricular block, first degree: Secondary | ICD-10-CM | POA: Diagnosis present

## 2022-08-06 DIAGNOSIS — Z950 Presence of cardiac pacemaker: Secondary | ICD-10-CM

## 2022-08-06 DIAGNOSIS — F17200 Nicotine dependence, unspecified, uncomplicated: Secondary | ICD-10-CM | POA: Diagnosis present

## 2022-08-06 DIAGNOSIS — R6510 Systemic inflammatory response syndrome (SIRS) of non-infectious origin without acute organ dysfunction: Secondary | ICD-10-CM | POA: Diagnosis not present

## 2022-08-06 DIAGNOSIS — I272 Pulmonary hypertension, unspecified: Secondary | ICD-10-CM

## 2022-08-06 DIAGNOSIS — J189 Pneumonia, unspecified organism: Principal | ICD-10-CM

## 2022-08-06 DIAGNOSIS — I110 Hypertensive heart disease with heart failure: Secondary | ICD-10-CM | POA: Diagnosis present

## 2022-08-06 DIAGNOSIS — I4821 Permanent atrial fibrillation: Secondary | ICD-10-CM | POA: Diagnosis present

## 2022-08-06 DIAGNOSIS — Z87448 Personal history of other diseases of urinary system: Secondary | ICD-10-CM

## 2022-08-06 DIAGNOSIS — E041 Nontoxic single thyroid nodule: Secondary | ICD-10-CM | POA: Diagnosis present

## 2022-08-06 DIAGNOSIS — Z6841 Body Mass Index (BMI) 40.0 and over, adult: Secondary | ICD-10-CM

## 2022-08-06 DIAGNOSIS — Q2112 Patent foramen ovale: Secondary | ICD-10-CM

## 2022-08-06 DIAGNOSIS — Z79899 Other long term (current) drug therapy: Secondary | ICD-10-CM

## 2022-08-06 DIAGNOSIS — J9601 Acute respiratory failure with hypoxia: Secondary | ICD-10-CM | POA: Diagnosis present

## 2022-08-06 DIAGNOSIS — Z86711 Personal history of pulmonary embolism: Secondary | ICD-10-CM

## 2022-08-06 DIAGNOSIS — Z86718 Personal history of other venous thrombosis and embolism: Secondary | ICD-10-CM

## 2022-08-06 DIAGNOSIS — Z7901 Long term (current) use of anticoagulants: Secondary | ICD-10-CM

## 2022-08-06 DIAGNOSIS — E876 Hypokalemia: Secondary | ICD-10-CM | POA: Diagnosis present

## 2022-08-06 DIAGNOSIS — I071 Rheumatic tricuspid insufficiency: Secondary | ICD-10-CM | POA: Diagnosis present

## 2022-08-06 DIAGNOSIS — I5082 Biventricular heart failure: Secondary | ICD-10-CM | POA: Diagnosis present

## 2022-08-06 DIAGNOSIS — Z7982 Long term (current) use of aspirin: Secondary | ICD-10-CM

## 2022-08-06 DIAGNOSIS — R109 Unspecified abdominal pain: Secondary | ICD-10-CM

## 2022-08-06 DIAGNOSIS — Z888 Allergy status to other drugs, medicaments and biological substances status: Secondary | ICD-10-CM

## 2022-08-06 DIAGNOSIS — E662 Morbid (severe) obesity with alveolar hypoventilation: Secondary | ICD-10-CM | POA: Diagnosis present

## 2022-08-06 DIAGNOSIS — E785 Hyperlipidemia, unspecified: Secondary | ICD-10-CM | POA: Diagnosis present

## 2022-08-06 DIAGNOSIS — I2510 Atherosclerotic heart disease of native coronary artery without angina pectoris: Secondary | ICD-10-CM | POA: Diagnosis present

## 2022-08-06 DIAGNOSIS — I4891 Unspecified atrial fibrillation: Secondary | ICD-10-CM | POA: Diagnosis present

## 2022-08-06 DIAGNOSIS — I253 Aneurysm of heart: Secondary | ICD-10-CM | POA: Diagnosis present

## 2022-08-06 DIAGNOSIS — K5900 Constipation, unspecified: Secondary | ICD-10-CM

## 2022-08-06 HISTORY — DX: Essential (primary) hypertension: I10

## 2022-08-06 LAB — CBC WITH DIFFERENTIAL/PLATELET
Abs Immature Granulocytes: 0.02 10*3/uL (ref 0.00–0.07)
Basophils Absolute: 0 10*3/uL (ref 0.0–0.1)
Basophils Relative: 0 %
Eosinophils Absolute: 0.2 10*3/uL (ref 0.0–0.5)
Eosinophils Relative: 3 %
HCT: 37.5 % (ref 36.0–46.0)
Hemoglobin: 12.8 g/dL (ref 12.0–15.0)
Immature Granulocytes: 0 %
Lymphocytes Relative: 17 %
Lymphs Abs: 1 10*3/uL (ref 0.7–4.0)
MCH: 28.1 pg (ref 26.0–34.0)
MCHC: 34.1 g/dL (ref 30.0–36.0)
MCV: 82.2 fL (ref 80.0–100.0)
Monocytes Absolute: 0.6 10*3/uL (ref 0.1–1.0)
Monocytes Relative: 9 %
Neutro Abs: 4.2 10*3/uL (ref 1.7–7.7)
Neutrophils Relative %: 71 %
Platelets: 221 10*3/uL (ref 150–400)
RBC: 4.56 MIL/uL (ref 3.87–5.11)
RDW: 20.9 % — ABNORMAL HIGH (ref 11.5–15.5)
WBC: 6 10*3/uL (ref 4.0–10.5)
nRBC: 0 % (ref 0.0–0.2)

## 2022-08-06 LAB — COMPREHENSIVE METABOLIC PANEL
ALT: 20 U/L (ref 0–44)
AST: 26 U/L (ref 15–41)
Albumin: 4.1 g/dL (ref 3.5–5.0)
Alkaline Phosphatase: 82 U/L (ref 38–126)
Anion gap: 13 (ref 5–15)
BUN: 24 mg/dL — ABNORMAL HIGH (ref 8–23)
CO2: 24 mmol/L (ref 22–32)
Calcium: 10.2 mg/dL (ref 8.9–10.3)
Chloride: 102 mmol/L (ref 98–111)
Creatinine, Ser: 1.28 mg/dL — ABNORMAL HIGH (ref 0.44–1.00)
GFR, Estimated: 44 mL/min — ABNORMAL LOW (ref >60)
Glucose, Bld: 138 mg/dL — ABNORMAL HIGH (ref 70–99)
Potassium: 4.4 mmol/L (ref 3.5–5.1)
Sodium: 139 mmol/L (ref 135–145)
Total Bilirubin: 1.3 mg/dL — ABNORMAL HIGH (ref 0.3–1.2)
Total Protein: 9 g/dL — ABNORMAL HIGH (ref 6.5–8.1)

## 2022-08-06 LAB — TROPONIN I (HIGH SENSITIVITY): Troponin I (High Sensitivity): 10 ng/L (ref <18)

## 2022-08-06 LAB — LIPASE, BLOOD: Lipase: 41 U/L (ref 11–51)

## 2022-08-06 MED ORDER — ONDANSETRON 4 MG PO TBDP: 4.0000 mg | ORAL_TABLET | Freq: Once | ORAL | Status: DC

## 2022-08-06 MED ORDER — ONDANSETRON HCL 4 MG/2ML IJ SOLN
INTRAMUSCULAR | Status: AC
  Administered 2022-08-07: 4 mg via INTRAVENOUS
  Filled 2022-08-06: qty 2

## 2022-08-06 MED ORDER — ONDANSETRON HCL 4 MG/2ML IJ SOLN
4.0000 mg | Freq: Once | INTRAMUSCULAR | Status: AC
  Administered 2022-08-06: 4 mg via INTRAVENOUS

## 2022-08-06 MED ORDER — IOHEXOL 350 MG/ML SOLN
60.0000 mL | Freq: Once | INTRAVENOUS | Status: AC | PRN
  Administered 2022-08-06: 60 mL via INTRAVENOUS

## 2022-08-06 NOTE — ED Triage Notes (Signed)
Pt presents for dysphagia that began suddenly today accompanied with N/V, L chest pain (this has been intermittent for months and radiates into back), and SOB.  H/o DM, HTN, Afib (warfarin, pacemaker).

## 2022-08-06 NOTE — ED Provider Triage Note (Signed)
Emergency Medicine Provider Triage Evaluation Note  Jolleen Seman , a 76 y.o. female  was evaluated in triage.  Pt complains of problems swallowing..  Patient had sudden onset of difficulty swallowing earlier today.  She has never had anything like this before.  She is unable to tolerate her own saliva.  She denies food impaction.  She states that every time she tries to swallow she vomits.  She denies nausea or chest pain  Review of Systems  Positive: Vomiting, dysphagia Negative: Chest pain or shortness of breath  Physical Exam  BP (!) 146/111 (BP Location: Left Arm)   Pulse 83   Temp 98.7 F (37.1 C) (Oral)   Resp (!) 24   SpO2 97%  Gen:   Awake, no distress   Resp:  Normal effort  MSK:   Moves extremities without difficulty  Other:  Actively vomiting and spitting as well as gagging  Medical Decision Making  Medically screening exam initiated at 8:56 PM.  Appropriate orders placed.  Sherol Sabas was informed that the remainder of the evaluation will be completed by another provider, this initial triage assessment does not replace that evaluation, and the importance of remaining in the ED until their evaluation is complete.  Work-up initiated   Margarita Mail, PA-C 08/06/22 2104

## 2022-08-07 ENCOUNTER — Observation Stay (HOSPITAL_BASED_OUTPATIENT_CLINIC_OR_DEPARTMENT_OTHER): Payer: Medicare Other

## 2022-08-07 ENCOUNTER — Other Ambulatory Visit: Payer: Self-pay

## 2022-08-07 ENCOUNTER — Encounter (HOSPITAL_COMMUNITY): Payer: Self-pay

## 2022-08-07 ENCOUNTER — Emergency Department (HOSPITAL_COMMUNITY): Payer: Medicare Other

## 2022-08-07 ENCOUNTER — Observation Stay (HOSPITAL_COMMUNITY): Payer: Medicare Other

## 2022-08-07 DIAGNOSIS — J9601 Acute respiratory failure with hypoxia: Secondary | ICD-10-CM | POA: Diagnosis present

## 2022-08-07 DIAGNOSIS — I2721 Secondary pulmonary arterial hypertension: Secondary | ICD-10-CM | POA: Diagnosis not present

## 2022-08-07 DIAGNOSIS — R1110 Vomiting, unspecified: Secondary | ICD-10-CM | POA: Diagnosis not present

## 2022-08-07 DIAGNOSIS — I509 Heart failure, unspecified: Secondary | ICD-10-CM

## 2022-08-07 DIAGNOSIS — I495 Sick sinus syndrome: Secondary | ICD-10-CM

## 2022-08-07 DIAGNOSIS — I5031 Acute diastolic (congestive) heart failure: Secondary | ICD-10-CM

## 2022-08-07 DIAGNOSIS — I4819 Other persistent atrial fibrillation: Secondary | ICD-10-CM

## 2022-08-07 DIAGNOSIS — I071 Rheumatic tricuspid insufficiency: Secondary | ICD-10-CM

## 2022-08-07 DIAGNOSIS — R1310 Dysphagia, unspecified: Secondary | ICD-10-CM

## 2022-08-07 DIAGNOSIS — Z950 Presence of cardiac pacemaker: Secondary | ICD-10-CM

## 2022-08-07 LAB — URINALYSIS, ROUTINE W REFLEX MICROSCOPIC
Bacteria, UA: NONE SEEN
Bilirubin Urine: NEGATIVE
Glucose, UA: >=500 mg/dL — AB
Hgb urine dipstick: NEGATIVE
Ketones, ur: 20 mg/dL — AB
Leukocytes,Ua: NEGATIVE
Nitrite: NEGATIVE
Protein, ur: NEGATIVE mg/dL
Specific Gravity, Urine: 1.032 — ABNORMAL HIGH (ref 1.005–1.030)
pH: 5 (ref 5.0–8.0)

## 2022-08-07 LAB — ECHOCARDIOGRAM COMPLETE
Height: 62 in
S' Lateral: 3.1 cm
Weight: 4080 oz

## 2022-08-07 LAB — TROPONIN I (HIGH SENSITIVITY): Troponin I (High Sensitivity): 8 ng/L (ref <18)

## 2022-08-07 LAB — BRAIN NATRIURETIC PEPTIDE: B Natriuretic Peptide: 136 pg/mL — ABNORMAL HIGH (ref 0.0–100.0)

## 2022-08-07 LAB — SARS CORONAVIRUS 2 BY RT PCR: SARS Coronavirus 2 by RT PCR: NEGATIVE

## 2022-08-07 LAB — LACTIC ACID, PLASMA: Lactic Acid, Venous: 1.1 mmol/L (ref 0.5–1.9)

## 2022-08-07 LAB — CBG MONITORING, ED: Glucose-Capillary: 134 mg/dL — ABNORMAL HIGH (ref 70–99)

## 2022-08-07 MED ORDER — IPRATROPIUM-ALBUTEROL 0.5-2.5 (3) MG/3ML IN SOLN
3.0000 mL | RESPIRATORY_TRACT | Status: DC | PRN
  Filled 2022-08-07: qty 3

## 2022-08-07 MED ORDER — INSULIN ASPART 100 UNIT/ML IJ SOLN
0.0000 [IU] | Freq: Three times a day (TID) | INTRAMUSCULAR | Status: DC
  Administered 2022-08-08 – 2022-08-09 (×3): 2 [IU] via SUBCUTANEOUS
  Administered 2022-08-09: 1 [IU] via SUBCUTANEOUS
  Administered 2022-08-10: 2 [IU] via SUBCUTANEOUS
  Administered 2022-08-10: 1 [IU] via SUBCUTANEOUS
  Administered 2022-08-10: 2 [IU] via SUBCUTANEOUS
  Administered 2022-08-11: 3 [IU] via SUBCUTANEOUS
  Administered 2022-08-12 – 2022-08-14 (×7): 2 [IU] via SUBCUTANEOUS
  Administered 2022-08-15: 3 [IU] via SUBCUTANEOUS
  Administered 2022-08-15: 1 [IU] via SUBCUTANEOUS
  Administered 2022-08-16: 3 [IU] via SUBCUTANEOUS
  Administered 2022-08-16: 2 [IU] via SUBCUTANEOUS
  Administered 2022-08-16 – 2022-08-17 (×3): 3 [IU] via SUBCUTANEOUS
  Administered 2022-08-18: 2 [IU] via SUBCUTANEOUS
  Administered 2022-08-18: 3 [IU] via SUBCUTANEOUS

## 2022-08-07 MED ORDER — ACETAMINOPHEN 500 MG PO TABS: 500.0000 mg | ORAL_TABLET | Freq: Four times a day (QID) | ORAL | 0 refills | Status: AC | PRN

## 2022-08-07 MED ORDER — ATORVASTATIN CALCIUM 40 MG PO TABS: 80.0000 mg | ORAL_TABLET | Freq: Every day | ORAL | Status: DC

## 2022-08-07 MED ORDER — METRONIDAZOLE 500 MG/100ML IV SOLN
500.0000 mg | Freq: Once | INTRAVENOUS | Status: AC
  Administered 2022-08-07: 500 mg via INTRAVENOUS
  Filled 2022-08-07: qty 100

## 2022-08-07 MED ORDER — LISINOPRIL 20 MG PO TABS: 20.0000 mg | ORAL_TABLET | Freq: Two times a day (BID) | ORAL | Status: DC

## 2022-08-07 MED ORDER — ACETAMINOPHEN 650 MG RE SUPP: 650.0000 mg | Freq: Four times a day (QID) | RECTAL | Status: DC | PRN

## 2022-08-07 MED ORDER — MORPHINE SULFATE (PF) 4 MG/ML IV SOLN
4.0000 mg | Freq: Once | INTRAVENOUS | Status: AC
  Administered 2022-08-07: 4 mg via INTRAVENOUS
  Filled 2022-08-07: qty 1

## 2022-08-07 MED ORDER — SODIUM CHLORIDE 0.9 % IV BOLUS
500.0000 mL | Freq: Once | INTRAVENOUS | Status: AC
  Administered 2022-08-07: 500 mL via INTRAVENOUS

## 2022-08-07 MED ORDER — SODIUM CHLORIDE 0.9 % IV SOLN
2.0000 g | Freq: Once | INTRAVENOUS | Status: AC
  Administered 2022-08-07: 2 g via INTRAVENOUS
  Filled 2022-08-07: qty 12.5

## 2022-08-07 MED ORDER — LACTATED RINGERS IV SOLN: INTRAVENOUS | Status: DC

## 2022-08-07 MED ORDER — IOHEXOL 350 MG/ML SOLN
100.0000 mL | Freq: Once | INTRAVENOUS | Status: AC | PRN
  Administered 2022-08-07: 70 mL via INTRAVENOUS

## 2022-08-07 MED ORDER — IPRATROPIUM-ALBUTEROL 0.5-2.5 (3) MG/3ML IN SOLN
3.0000 mL | RESPIRATORY_TRACT | Status: DC
  Administered 2022-08-07 – 2022-08-08 (×5): 3 mL via RESPIRATORY_TRACT
  Filled 2022-08-07 (×4): qty 3

## 2022-08-07 MED ORDER — VANCOMYCIN HCL 1500 MG/300ML IV SOLN
1500.0000 mg | Freq: Once | INTRAVENOUS | Status: AC
  Administered 2022-08-07: 1500 mg via INTRAVENOUS
  Filled 2022-08-07: qty 300

## 2022-08-07 MED ORDER — ONDANSETRON HCL 4 MG/2ML IJ SOLN
4.0000 mg | Freq: Once | INTRAMUSCULAR | Status: AC
  Filled 2022-08-07: qty 2

## 2022-08-07 MED ORDER — PANTOPRAZOLE SODIUM 40 MG IV SOLR: 40.0000 mg | Freq: Once | INTRAVENOUS | Status: DC

## 2022-08-07 MED ORDER — VANCOMYCIN HCL IN DEXTROSE 1-5 GM/200ML-% IV SOLN
1000.0000 mg | Freq: Once | INTRAVENOUS | Status: DC
  Filled 2022-08-07: qty 200

## 2022-08-07 MED ORDER — ASPIRIN 81 MG PO TBEC: 81.0000 mg | DELAYED_RELEASE_TABLET | Freq: Every day | ORAL | Status: DC

## 2022-08-07 MED ORDER — SODIUM CHLORIDE 0.9 % IV SOLN
2.0000 g | Freq: Two times a day (BID) | INTRAVENOUS | Status: DC
  Administered 2022-08-08 – 2022-08-10 (×5): 2 g via INTRAVENOUS
  Filled 2022-08-07 (×5): qty 12.5

## 2022-08-07 MED ORDER — ACETAMINOPHEN 500 MG PO TABS
1000.0000 mg | ORAL_TABLET | Freq: Once | ORAL | Status: AC
  Administered 2022-08-07: 1000 mg via ORAL
  Filled 2022-08-07: qty 2

## 2022-08-07 MED ORDER — VANCOMYCIN HCL 750 MG/150ML IV SOLN: 750.0000 mg | INTRAVENOUS | Status: DC

## 2022-08-07 MED ORDER — PANTOPRAZOLE SODIUM 40 MG IV SOLR
40.0000 mg | Freq: Once | INTRAVENOUS | Status: AC
  Administered 2022-08-07: 40 mg via INTRAVENOUS
  Filled 2022-08-07: qty 10

## 2022-08-07 NOTE — Progress Notes (Addendum)
FMTS Interim Progress Note  S: Called by RN with patient de-sating to the 80s on 5L [place] and having increased work of breathing. Patient placed on non-rebreather with O2 sat improving to high 90s. RT at bedside with placement of high flow nasal canula to 14 L with O2 sats to the low 90s.   Patient endorses worsening chest pain that is reproducible on exam that is extremely tender.   Patient assessed with Dr. Larae Grooms at bedside.   O: BP 121/72   Pulse 93   Temp 99 F (37.2 C)   Resp (!) 23   Ht 5' 2" (1.575 m)   Wt 115.7 kg   SpO2 94%   BMI 46.64 kg/m   Chronically-ill appearing, increased work of breathing with use of accessory muscles.  Rate controled, irregular rate.   A/P:  Acute respiratory decompensation:  - Stabilized on 14L HFNC - STAT CXR stable from 9/27; new infiltrates in the right lower lung favored to represent edema though infection no excluded - Order STAT CTA chest for r/o PE  - EKG resulted consistent to previous - rate controled a fib with no st-elevations  Chest Pain:  - repeat EKG stable  - order tylenol suppository due to NPO order   Darci Current, DO 08/07/2022, 10:56 PM PGY-1, [place] [facility] Service pager [phone]

## 2022-08-07 NOTE — Progress Notes (Signed)
Pharmacy Antibiotic Note  Candice Mclean is a 77 y.o. female admitted on 08/06/2022 with unknown source/sepsis.  Pharmacy has been consulted for cefepime and vancomycin dosing. Patient presented with chest pain, difficulty swallowing. PMH: afib (pacemaker), CHF. Fever of 101, Scr 1.28 (bl~unknown).   Plan: Vancomycin 1553m x1 dose Vancomycin 7549m IV every 24 hours. (eAUC 430, IBW, Vd 0.5)  Cefepime 2g q12h Monitor for signs of clinical improvement, fever curve, cultures, WBC.  Height: _0  (157.5 cm) Weight: 115.7 kg (255 lb) IBW/kg (Calculated) : 50.1  Temp (24hrs), Avg:99.4 F (37.4 C), Min:98.7 F (37.1 C), Max:101 F (38.3 C)  Recent Labs  Lab 08/06/22 2100  WBC 6.0  CREATININE 1.28*    Estimated Creatinine Clearance: 47.1 mL/min (A) (by C-G formula based on SCr of 1.28 mg/dL (H)).    Allergies  Allergen Reactions   Versed [Midazolam] Hives   Tylenol With Codeine #3 [Acetaminophen-Codeine] Palpitations    Antimicrobials this admission: Vanc 9/28 >>  Cefepime 9/28 >>   Microbiology results: 9/28 BCx: pending 9/28 UCx: pending  Covid test: negative  Thank you for allowing pharmacy to be a part of this patient's care.  Candice RinksySanta [facility] Hospital/28/2023 2:25 PM

## 2022-08-07 NOTE — Progress Notes (Signed)
FMTS Interim Progress Note  Spoke to on-call cardiology consult for abnormal echo showed severe PAH and severe tricuspid regurgitation with impacting pacemaker lead. Cardiology to see patient while inpatient.  Colletta Maryland, MD 08/07/2022, 6:35 PM PGY-1, [place] [facility] Service pager [phone]

## 2022-08-07 NOTE — Progress Notes (Signed)
Placed pt. On HFNC for pt. Distress and low sats. DO at bedside.

## 2022-08-07 NOTE — Assessment & Plan Note (Addendum)
Patient reports improvement of breathing. Patient on 4 L nasal cannula, continue to wean as tolerated.  Anticipate she will be discharged with oxygen. - BiPAP at night as tolerated - As needed DuoNebs every 4 for wheezing - Incentive spirometry, pulmonary hygiene - Monitor oxygen saturation, wean as tolerated

## 2022-08-07 NOTE — Hospital Course (Addendum)
Candice Mclean is a 76 y.o.female with a history of A-fib, CHF, DM, HTN and who was admitted for sudden onset dysphagia with associated vomiting and subsequently developed SOB.Her hospital course is detailed below:  Vomiting: Patient with sudden onset dysphagia and associated vomiting prior to hospitalization. Upon admission, patient with persistent pharyngeal pain and diffusely tender abdomen. CT abdomen negative for acute etiology. GI consulted and did not recommend intervention but continue PPI therapy. Patient passed SLP eval, started on regular diet with thin liquid. Symptoms improved throughout hospital course.  Resolved and asymptomatic at time of discharge.  Acute respiratory failure with hypoxia: Patient required 3L [place] supplemental oxygen upon admission due to persistent desat and increased WOB. Patient met SIRS criteria for tachypnea and febrile episode. She was started on broad spectrum antibiotics. Patient continued to desat and have increased WOB and required 20L HFNC with transition to BiPaP nightly. CTA negative for PE. Showed potential LLL infiltrate and small airway disease. Pulmonology consulted and recommended antibiotic treatment, she was treated with cefepime and azithromycin. She completed her antibiotic course, and pulmonary infiltrates improved on x-ray.   Additionally cardiology was consulted and believed acute respiratory failure could also be secondary to severe worsening right-sided heart failure/pulmonary arterial hypertension. Cardiology did full work-up for PAH and heart failure which included heart cath, bubble study, TEE (see below).  Patient was weaned from their high flow nasal cannula to 5 L [place] during admission.  PT recommended SNF, as patient remains weak and has difficulty ambulating.  On 10/8 patient no longer required BiPap overnight to maintain O2 sat, pulmonology discontinued NIV.  Pulmonology recommended outpatient sleep study.  Additionally an ambulatory pulse o2  showed***at this time we believe her acute respiratory failure was multifactorial and included pneumonia, PAH, CHF and OSA/obesity hypoventilation syndrome.  Patient was stable and discharged to SNF.  CHF: Left ventricular ejection fraction 55 to 60%, severe tricuspid regurgitation on echo.  Cardiology was consulted, diuresed patient with Lasix 3 times daily, and started spironolactone per cardiology. 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  Heart cath was completed and no stents were placed during this admission.  She was switched to p.o. 36m torsemide daily, and started on 25 mg spironolactone per cardiology.  Consider restarting home lisinopril and Jardiance for goal-directed medical therapy.  [facility]  Patient was hemodynamically stable to discharge to SNF.  Pulmonary HTN: Extensive work-up included TEE and heart cath.  Per radiology it is believed that [place] WHO [place] 3.  Pulmonology recommended BiPAP for suspected OSA/obesity hypoventilation syndrome.  Patient's breathing continued to improve, and she tolerates BiPAP well.  Pulm/cards recommends outpatient sleep study.  Patient on room air***.  Afib: Patient with pacemaker.  Patient was switched from Coumadin to heparin, for possible surgical intervention.  DOAC was considered however patient is unable to afford this medication.  Warfarin was restarted.  Rate was controlled with metoprolol 25 mg twice daily.  Patient was asymptomatic and stable at time of discharge.  PCP Follow-up Recommendations: Ensure patient has appropriate pulmonology and cardiology follow-up Outpatient sleep study for OSA Work towards optimizing goal-directed therapy for CHF

## 2022-08-07 NOTE — H&P (Addendum)
Hospital Admission History and Physical Service Pager: [phone]  Patient name: Candice Mclean Medical record number: [id number] Date of Birth: 1946/03/14 Age: 76 y.o. Gender: female  Primary Care Provider: Janene Madeira, Vernice Jefferson, MD Consultants: None  Code Status: FULL  Preferred Emergency Contact:  Contact Information     Name Relation Home Work [place] Daughter [phone]  [phone]        Chief Complaint: Vomiting  Assessment and Plan: Candice Mclean is a 76 y.o. female presenting with sudden onset dysphagia with associated vomiting and subsequently developed SOB. 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  Differential diagnosis includes esophageal injury/irritation due to persistent neck and chest pain and associated hematemesis. Less likely differential includes esophageal rupture (some clinical improvement). Potentially meeting SIRS criteria due to infectious source such as PNA from aspiration event (negative lung sounds, CT suggests patchy infiltrates). Less likely infectious source could be gastroenteritis (denies diarrhea).   * Vomiting Sudden onset forceful vomiting and choking that occurred yesterday. One episode of hematemesis. Denies associated nausea. Patient subsequently with persistent dysphagia and diffuse abdominal pain with guarding. Pain likely secondary to esophageal irritation from GI contents. -Admitted to FMTS, Dr. Andria Frames attending -GI consulted, recs appreciated -Protonix 26m IV -NPO pending SLP eval and potential scope -CT abdomen: patchy infiltrates in both lower lung fields. Unremarkable for GI patholgoy -CT neck: nonspecific narrowing of the left vallecula -CXR: Cardiomegaly with vascular congestion -Cardiac monitoring -Vitals per protocol   Acute respiratory failure with hypoxia  ([place] Patient required 2L oxygen in the ED due to persistent oxygen sat in 80s. No baseline oxygen requirement. H/o asthma, patient with mild wheezing on exam. Some concern for PNA on CT Abdomen, potential aspiration event given vomiting episode. On exam, lung field not suggestive of consolidation. Patient meets SIRS criteria with tachypnea and one episode of fever in ED. COVID negative. -PRN Duonebs q4 for wheezing -Monitor oxygen saturation, wean as tolerated  -Received dose of broad spectrum antibiotics, consider continuing pending GI eval -On LR @ 1524mhr per sepsis protocol -Incentive spiratometry -Consider CT chest to further elucidate symptoms  A-fib (HCC) Currently in A-fib, rate controlled. Patient has pacemaker that is due to be replaced. On Warfarin and seen regularly for titration. -Restart Warfarin tomorrow -Telemetry monitoring  CHF (congestive heart failure) (HCC) Euvolemic on exam with only mild pedal edema. Takes Lasix 2053maily at home. Most recent echo in 2018. -Ordered echo -Hold Lasix   Diabetes (HCCDormonter patient, diabetes is well controlled and most recent A1c around 7 (no value in record). On Metformin and Jardiance at home for management. -sSSI -CBG q6    FEN/GI: NPO pending SLP eval VTE Prophylaxis: Warfarin   Disposition: Admit to FMTS  History of Present Illness:  Candice Mclean a 75 52o. female presenting with sudden onset difficulty swallowing yesterday afternoon that was associated with forceful vomiting. Patient states it felt like choking and was an intense burning sensation. States she "threw up everything she ate the past few days" and had one occurrence where blood was present in the vomit. Denies associated nausea. States it has improved since being in the ED for 14 hours and now she can take sips of water without issue. Denies SOB of initially but develop some dyspnea in the ED that required oxygen to feel comfortable. No baseline oxygen  requirement.   Patient also notes  subjective fever over the course of last week. Denies URI symptoms until a cough started on Sunday, 9/24. States she has been eating and drinking normally before the choking episode. Denies dysuria.  Patient endorses chest and back pain that she typically gets when she is in a-fib. States it has been present for a few months since she had been in a-fib that long. States she needs to get a new pacemaker because the current one has not been controlling her rhythm properly. States she taker her Warfarin as prescribed and last saw the Warfarin clinic 07/17/2022.  Patient accompanied by son at bedside. States she last took her medication yesterday and had not taken any meds today.   In the ED, patient started on empiric antibiotics for CT imaging showing atelectasis vs pneumonia. Started on fluids per sepsis protocol. Received Zofran 76m x 2 and had no subsequent episodes of vomiting.  Review Of Systems: As above  Pertinent Past Medical History: Past Medical History:  Diagnosis Date   AF (atrial fibrillation) (HCC)    CHF (congestive heart failure) (HCC)    Coronary artery disease    Diabetes mellitus without complication ([place]    Pacemaker     Remainder reviewed in history tab.   Pertinent Past Surgical History: None Remainder reviewed in history tab.   Pertinent Social History: Tobacco use: No Alcohol use: Former, years ago  Other Substance use: None  Lives alone   Pertinent Family History: None Remainder reviewed in history tab.   Important Outpatient Medications: Warfarin 521mdaily except Thursday take 7.19m8mlbuterol Lasix 73m64mily Metformin 1000mg53mly Metoprolol 219mg 49mAspirin 81mg A619mastatin 80mg No47m5-3219mg q6 39mLisinopril 73mg BID 66mpin 10mg MWSat78mainder reviewed in medication history.   Objective: BP 139/81   Pulse 87   Temp (!) 101 F (38.3 C) (Oral)   Resp 18   Ht _0  (1.575 m)   Wt 115.7 kg   SpO2  96%   BMI 46.64 kg/m  Exam: General: Ashen, ill-appearing Eyes: Erythematous sclera ENTM: Dry oral mucosa. No posterior throat erythema Neck: Supple. Non-tender. Asymmetric soft tissue with more fullness in R anterior neck. Cardiovascular: Irregularly irregular rhythm. Normal rate. No murmur Respiratory: Mild expiratory wheeze in lung bases. Comfortable work of breathing on [place] 3L Gastrointestinal: Soft. Guarding to palpation. Diffusely tender in all quadrants to palpation. MSK: Trace pedal edema. Derm: Warm, dry Neuro: CN intact. Motor and sensation intact globally. Psych: Cooperative. Pleasant.  Labs:  CBC BMET  Recent Labs  Lab 08/06/22 2100  WBC 6.0  HGB 12.8  HCT 37.5  PLT 221   Recent Labs  Lab 08/06/22 2100  NA 139  K 4.4  CL 102  CO2 24  BUN 24*  CREATININE 1.28*  GLUCOSE 138*  CALCIUM 10.2    Pertinent additional labs: BNP: 136 Troponin: 8 COVID neg UA: Glucose 500+, Ketones 20  EKG: Afib, normal rate   Imaging Studies Performed: CT ABDOMEN PELVIS W CONTRAST Result Date: 08/07/2022 IMPRESSION: There is no evidence of intestinal obstruction or pneumoperitoneum. There is no hydronephrosis. Left kidney is much smaller than right which may be a congenital variation or suggest scarring from chronic pyelonephritis or renal artery stenosis. There are patchy infiltrates in both lower lung fields suggesting atelectasis/pneumonia. Cystocele in urinary bladder. Degenerative changes are noted in right hip. Other findings as described in the body of the report. Electronically Signed   By: Palani  RatElmer Picker 08/07/2022 14:51   CT Soft  Tissue Neck W Contrast Result Date: 08/06/2022 IMPRESSION: 1. Narrowing of the left vallecula, which is nonspecific. Recommend direct visualization. 2. No additional acute process in the neck. Electronically Signed   By: Merilyn Baba M.D.   On: 08/06/2022 23:23   DG Chest 2 View Result Date: 08/06/2022 IMPRESSION:  Cardiomegaly with vascular congestion Electronically Signed   By: Donavan Foil M.D.   On: 08/06/2022 21:47      Colletta Maryland, MD 08/07/2022, 5:12 PM PGY-1, [place] [facility] pager: [phone], text pages welcome Secure chat group [place]    Per GI: Continue with INR, Hold Warfarin, Add PPI daily, will see in AM    Upper Level Addendum:  I have seen and evaluated this patient along with Dr. Jerilee Hoh and reviewed the above note, making necessary revisions as appropriate.  I agree with the medical decision making and physical exam as noted above.  Erskine Emery, MD PGY-2 [facility]

## 2022-08-07 NOTE — Progress Notes (Signed)
I saw and examined this patient.  I discussed her with the full resident admitting team.  We have agreed on a treatment plan.  I will cosign the H&PE when available.  Briefly, 76 yo female in her usual state of health with well controled chronic problems had the abrupt onset of vomiting yesterday and then could not swallow anything.  Came to ER.  Spent 14 hours in the waiting room and several more hours in work up.  Now feeling better and tolerating sips of clear fluids.  Does have the feeling of things catching in her mid chest.  Issues: The very large question is "How sick is this patient."  The jury is still out.  On the severe end of our differential diagnosis is that she meets SIRS criteria.  She is cultured and has been given broad spectrum antibiotics.  No clear source.  On the very mild end of the spectrum is that she simply had esophageal irritation/spasm from her vomiting and is a bit on the dry side from her long time in the ER.  I tend to favor the latter diagnosis since she seems to be improving on her own.  Still, we will not bet her life on my intuition.  Continue antibiotics overnight.  Discuss with GI further imaging (CT of chest to R/O espophageal rupture) and/or EGD.  Hold coumadin, aspirin and furosemide.  Speech to do swallow eval.  Revaluate in the morning.  I hope that this is not SIRS.  Time will tell. See resident H&PE for chronic medical problems.

## 2022-08-07 NOTE — Progress Notes (Signed)
FMTS Interim Progress Note  S: Assessed with Dr. Larae Grooms. Patient resting comfortably. Denies worsening SOB. Continues to need O2 support. Reports worsening SOB the last months and has required her home inhaler much more frequently. Her throat is very sore but she remains NPO pending speech evaluation.   O: BP 139/81   Pulse 87   Temp (!) 101 F (38.3 C) (Oral)   Resp 18   Ht 5' 2" (1.575 m)   Wt 115.7 kg   SpO2 96%   BMI 46.64 kg/m   Chronically ill appearing, no acute distress Cardio: irregular rhythm, rate controled Pulm: bilateral wheezing, no increased work of breathing  Extremities: mild peripheral edema   A/P: A fib:  - IV heparin until we can transition to PO meds and INR >2  - resume metoprolol when able to take PO - potential for TEE from cards   Acute Respiratory failure: - cont O2 as needed  - duonebs ordered PRN  - outpatient OSA workup recommended by cards   Vomiting:  - NPO, pending SLP eval and possible scope by GI  - cont PPI  Darci Current, DO 08/07/2022, 8:35 PM PGY-1, [place] [facility] Service pager [phone]

## 2022-08-07 NOTE — ED Provider Notes (Signed)
[facility] EMERGENCY DEPARTMENT Provider Note   CSN: [id number] Arrival date & time: 08/06/22  2030     History  Chief Complaint  Patient presents with   Chest Pain   Dysphagia    Candice Mclean is a 77 y.o. female.  The history is provided by the patient.  Chest Pain Associated symptoms: abdominal pain, cough, dysphagia, fatigue, nausea, shortness of breath and vomiting   Associated symptoms: no fever    Patient with history of HTN, DM, Afib on warfarin, and pacemaker presents with vomiting and dysphagia. She describes difficulty swallowing solid foods and liquids for 1 week. She feels at times that the food or water gets stuck. Vomiting started after dinner last night. Non-bloody emesis. Endorses sore throat afterwards. Coughing with some dry heaving with sips of water. Short of breath at times with the coughing. She reports having left-sided chest pain for 1 week. Describes it as burning sensation intermittently. Denies abdominal pain or history of GERD. No recent illness. No changes in appetite or early sensation of fullness.     Home Medications Prior to Admission medications   Medication Sig Start Date End Date Taking? Authorizing Provider  acetaminophen (TYLENOL) 500 MG tablet Take 1 tablet (500 mg total) by mouth every 6 (six) hours as needed. 08/07/22  Yes Plunkett, Loree Fee, MD  albuterol (PROVENTIL HFA;VENTOLIN HFA) 108 (90 Base) MCG/ACT inhaler Inhale 2 puffs into the lungs every 2 (two) hours as needed for wheezing or shortness of breath. 01/10/17   Elwin Mocha, MD  aspirin EC 81 MG tablet Take 81 mg by mouth daily.    [provider]  atorvastatin (LIPITOR) 80 MG tablet Take 80 mg by mouth daily.    [provider]  doxepin (SINEQUAN) 10 MG capsule Take 10 mg by mouth 3 (three) times a week. Monday, Wednesday and Saturday    [provider]  furosemide (LASIX) 20 MG tablet Take 20 mg by mouth daily.    [provider]  HYDROcodone-acetaminophen (NORCO/VICODIN) 5-325 MG tablet Take 1 tablet by mouth every 6 (six) hours as needed for moderate pain. 01/10/17   Elwin Mocha, MD  latanoprost (XALATAN) 0.005 % ophthalmic solution Place 1 drop into both eyes at bedtime.    [provider]  lisinopril (PRINIVIL,ZESTRIL) 20 MG tablet Take 20 mg by mouth 2 (two) times daily.    [provider]  magnesium oxide (MAG-OX) 400 (241.3 Mg) MG tablet Take 400-800 mg by mouth 2 (two) times daily. Two tablets in the morning and 1 tablet in the evening    [provider]  metFORMIN (GLUCOPHAGE) 500 MG tablet Take 1,000 mg by mouth daily with breakfast.    [provider]  metoprolol tartrate (LOPRESSOR) 25 MG tablet Take 125 mg by mouth 2 (two) times daily.    [provider]  montelukast (SINGULAIR) 10 MG tablet Take 10 mg by mouth at bedtime.    [provider]  Potassium 99 MG TABS Take 1 tablet by mouth daily.    [provider]  warfarin (COUMADIN) 5 MG tablet Take 5 mg by mouth daily. Take 1 tablet (24m) daily except Thursday take 1.5 tablets  (7.546m.    [provider]      Allergies    Versed [midazolam] and Tylenol with codeine #3 [acetaminophen-codeine]    Review of Systems   Review of Systems  Constitutional:  Positive for fatigue. Negative for chills and fever.  HENT:  Positive  for sore throat and trouble swallowing.   Respiratory:  Positive for cough and shortness of breath.   Cardiovascular:  Positive for chest pain.  Gastrointestinal:  Positive for abdominal pain, constipation, nausea and vomiting.    Physical Exam Updated Vital Signs BP 139/81   Pulse 87   Temp (!) 101 F (38.3 C) (Oral)   Resp 18   Ht _0  (1.575 m)   Wt 115.7 kg   SpO2 96%   BMI 46.64 kg/m  Physical Exam Constitutional:      Appearance: She is ill-appearing.  HENT:     Head: Normocephalic and atraumatic.     Mouth/Throat:     Mouth:  Mucous membranes are moist.     Tongue: No lesions.     Palate: No lesions.     Pharynx: Oropharynx is clear.  Cardiovascular:     Rate and Rhythm: Normal rate.  Pulmonary:     Effort: Pulmonary effort is normal.     Breath sounds: Normal breath sounds.  Abdominal:     Palpations: Abdomen is soft.     Tenderness: There is abdominal tenderness. There is no guarding.  Musculoskeletal:     Cervical back: Normal range of motion and neck supple.  Skin:    General: Skin is warm and dry.  Neurological:     Mental Status: She is alert.     ED Results / Procedures / Treatments   Labs (all labs ordered are listed, but only abnormal results are displayed) Labs Reviewed  CBC WITH DIFFERENTIAL/PLATELET - Abnormal; Notable for the following components:      Result Value   RDW 20.9 (*)    All other components within normal limits  COMPREHENSIVE METABOLIC PANEL - Abnormal; Notable for the following components:   Glucose, Bld 138 (*)    BUN 24 (*)    Creatinine, Ser 1.28 (*)    Total Protein 9.0 (*)    Total Bilirubin 1.3 (*)    GFR, Estimated 44 (*)    All other components within normal limits  BRAIN NATRIURETIC PEPTIDE - Abnormal; Notable for the following components:   B Natriuretic Peptide 136.0 (*)    All other components within normal limits  URINALYSIS, ROUTINE W REFLEX MICROSCOPIC - Abnormal; Notable for the following components:   APPearance HAZY (*)    Specific Gravity, Urine 1.032 (*)    Glucose, UA >=500 (*)    Ketones, ur 20 (*)    All other components within normal limits  SARS CORONAVIRUS 2 BY RT PCR  CULTURE, BLOOD (SINGLE)  URINE CULTURE  CULTURE, BLOOD (SINGLE)  LIPASE, BLOOD  URINALYSIS, ROUTINE W REFLEX MICROSCOPIC  LACTIC ACID, PLASMA  LACTIC ACID, PLASMA  TROPONIN I (HIGH SENSITIVITY)  TROPONIN I (HIGH SENSITIVITY)    EKG EKG Interpretation  Date/Time:  Wednesday August 06 2022 21:20:17 EDT Ventricular Rate:  80 PR Interval:    QRS  Duration: 76 QT Interval:  324 QTC Calculation: 373 R Axis:   76 Text Interpretation: Atrial fibrillation Marked ST abnormality, possible inferior subendocardial injury Artifact When compared with ECG of 10-Jan-2017 04:41, PREVIOUS ECG IS PRESENT Confirmed by Blanchie Dessert [phone]) on 08/07/2022 8:44:39 AM  Radiology CT ABDOMEN PELVIS W CONTRAST  Result Date: 08/07/2022 CLINICAL DATA:  Abdominal pain EXAM: CT ABDOMEN AND PELVIS WITH CONTRAST TECHNIQUE: Multidetector CT imaging of the abdomen and pelvis was performed using the standard protocol following bolus administration of intravenous contrast. RADIATION DOSE REDUCTION: This exam was performed according to the  departmental dose-optimization program which includes automated exposure control, adjustment of the mA and/or kV according to patient size and/or use of iterative reconstruction technique. CONTRAST:  58m OMNIPAQUE IOHEXOL 350 MG/ML SOLN COMPARISON:  None Available. FINDINGS: Lower chest: There are patchy infiltrates in both lower lung fields. Heart is enlarged in size. Pacemaker leads are noted in place. Hepatobiliary: Liver measures 18.2 cm in length. No focal abnormalities are seen. There is no dilation of bile ducts. Pancreas: No focal abnormalities are seen. Spleen: Unremarkable. Adrenals/Urinary Tract: Adrenals are unremarkable. There is no hydronephrosis. Left kidney is much smaller than right. There are areas of cortical thinning in the left kidney. There are no renal or ureteral stones. There is no perinephric fluid collection. Urinary bladder is not distended. There is contrast in the urinary bladder lumen, most likely residual from previous intravenous contrast administration. Lower margin of the urinary bladder is extending below the pubic symphysis suggesting cystocele. Stomach/Bowel: Stomach is unremarkable. Small bowel loops are not dilated. Appendix is not dilated. There is no significant wall thickening in colon. There is no  pericolic stranding. Vascular/Lymphatic: Scattered arterial calcifications are seen. Reproductive: Uterus is not seen. Other: There is no ascites or pneumoperitoneum. Right inguinal hernia containing fat is seen. Musculoskeletal: Degenerative changes are noted in right hip. IMPRESSION: There is no evidence of intestinal obstruction or pneumoperitoneum. There is no hydronephrosis. Left kidney is much smaller than right which may be a congenital variation or suggest scarring from chronic pyelonephritis or renal artery stenosis. There are patchy infiltrates in both lower lung fields suggesting atelectasis/pneumonia. Cystocele in urinary bladder. Degenerative changes are noted in right hip. Other findings as described in the body of the report. Electronically Signed   By: PElmer PickerM.D.   On: 08/07/2022 14:51   CT Soft Tissue Neck W Contrast  Result Date: 08/06/2022 CLINICAL DATA:  Difficulty swallowing today EXAM: CT NECK WITH CONTRAST TECHNIQUE: Multidetector CT imaging of the neck was performed using the standard protocol following the bolus administration of intravenous contrast. RADIATION DOSE REDUCTION: This exam was performed according to the departmental dose-optimization program which includes automated exposure control, adjustment of the mA and/or kV according to patient size and/or use of iterative reconstruction technique. CONTRAST:  634mOMNIPAQUE IOHEXOL 350 MG/ML SOLN COMPARISON:  None Available. FINDINGS: Pharynx and larynx: Narrowing of the left vallecula (series 3, image 46). No other mass or swelling. Salivary glands: No inflammation, mass, or stone. Thyroid: Normal. Lymph nodes: None enlarged or abnormal density. Vascular: Patent. Limited intracranial: Negative. Visualized orbits: Negative. Mastoids and visualized paranasal sinuses: Clear. Skeleton: Degenerative changes in the cervical spine. No acute osseous abnormality. Upper chest: Air trapping. No focal pulmonary opacity or pleural  effusion. Other: None. IMPRESSION: 1. Narrowing of the left vallecula, which is nonspecific. Recommend direct visualization. 2. No additional acute process in the neck. Electronically Signed   By: AlMerilyn Baba.D.   On: 08/06/2022 23:23   DG Chest 2 View  Result Date: 08/06/2022 CLINICAL DATA:  Cough EXAM: CHEST - 2 VIEW COMPARISON:  01/10/2017 FINDINGS: Left-sided pacing device as before. Cardiomegaly with vascular congestion. No acute consolidation, pleural effusion or pneumothorax. Aortic atherosclerosis IMPRESSION: Cardiomegaly with vascular congestion Electronically Signed   By: KiDonavan Foil.D.   On: 08/06/2022 21:47    Procedures Procedures    Medications Ordered in ED Medications  lactated ringers infusion ( Intravenous New Bag/Given 08/07/22 1452)  metroNIDAZOLE (FLAGYL) IVPB 500 mg (500 mg Intravenous New Bag/Given 08/07/22 1454)  vancomycin (VANCOREADY)  IVPB 1500 mg/300 mL (has no administration in time range)  vancomycin (VANCOREADY) IVPB 750 mg/150 mL (has no administration in time range)  ceFEPIme (MAXIPIME) 2 g in sodium chloride 0.9 % 100 mL IVPB (has no administration in time range)  ondansetron (ZOFRAN) injection 4 mg (4 mg Intravenous Given 08/06/22 2104)  iohexol (OMNIPAQUE) 350 MG/ML injection 60 mL (60 mLs Intravenous Contrast Given 08/06/22 2249)  morphine (PF) 4 MG/ML injection 4 mg (4 mg Intravenous Given 08/07/22 1109)  ondansetron (ZOFRAN) injection 4 mg (4 mg Intravenous Given 08/07/22 1109)  pantoprazole (PROTONIX) injection 40 mg (40 mg Intravenous Given 08/07/22 1108)  sodium chloride 0.9 % bolus 500 mL (0 mLs Intravenous Stopped 08/07/22 1456)  ceFEPIme (MAXIPIME) 2 g in sodium chloride 0.9 % 100 mL IVPB (0 g Intravenous Stopped 08/07/22 1437)  acetaminophen (TYLENOL) tablet 1,000 mg (1,000 mg Oral Given 08/07/22 1407)  iohexol (OMNIPAQUE) 350 MG/ML injection 100 mL (70 mLs Intravenous Contrast Given 08/07/22 1433)    ED Course/ Medical Decision Making/ A&P                            Medical Decision Making Amount and/or Complexity of Data Reviewed Labs: ordered. Radiology: ordered.  Risk OTC drugs. Prescription drug management.  Patient presents with acute dysphagia and vomiting. Able to tolerate a few sips of water. COVID negative. BNP mildly elevated. Lipase negative. CT neck showed narrowing of left vallecula but no swelling or edema. Hypoxic during ED with O2 sats in upper 80s with tachypnea.  She became febrile of 101 in ED, started sepsis workup. Started on empiric antibiotics and IV fluids. CT A/P showed patchy bibasilar infiltrates suggestive of pneumonia. Will need hospital admission.    Final Clinical Impression(s) / ED Diagnoses Final diagnoses:  Pneumonia of both lower lobes due to infectious organism    Rx / DC Orders ED Discharge Orders          Ordered    acetaminophen (TYLENOL) 500 MG tablet  Every 6 hours PRN        08/07/22 1157              Angelique Blonder, DO 08/07/22 1522    Blanchie Dessert, MD 08/09/22 2032

## 2022-08-07 NOTE — Progress Notes (Signed)
  Echocardiogram 2D Echocardiogram has been performed.  Candice Mclean 08/07/2022, 5:10 PM

## 2022-08-07 NOTE — ED Notes (Signed)
This writer came into the room to reassess patient vitals. Pt was already on 4L [place] with oxygen saturations maintaining at 87%. Pt had no improvement with nasal cannula and pt was noted to work harder to breath. She was placed on 15L [facility] and O2 sats are now at 96%. Hennie Duos RN notified and respiratory called.

## 2022-08-07 NOTE — ED Notes (Signed)
Pt has CHF, total fluid resuscitation not ordered for Sepsis workup due to that reason.

## 2022-08-07 NOTE — Assessment & Plan Note (Addendum)
Net down 9.8 L. Weight stable 116.1 kg -Continue spironolactone 25 mg daily - P.o. torsemide 20 mg daily

## 2022-08-07 NOTE — Sepsis Progress Note (Signed)
Notified bedside nurse of need to draw lactic acid.

## 2022-08-07 NOTE — ED Notes (Addendum)
Pt c/o 8/10 pain in chest that is radiating back. She also stated she is having difficulty swallowing , hasn't had anything to drink since 08/06/22 at 1600. She states has a pacemaker. She states she is a fib.

## 2022-08-07 NOTE — ED Notes (Signed)
Pt O2 sats 88-89% placed on 2 L

## 2022-08-07 NOTE — Assessment & Plan Note (Addendum)
Irregular rate and rhythm on exam. INR 1.5 10/9 - Warfarin bridged with heparin - Home metoprolol 47m BID

## 2022-08-07 NOTE — Assessment & Plan Note (Addendum)
Blood glucose levels remain mostly within goal. -sSSI -CBG q6

## 2022-08-07 NOTE — ED Notes (Signed)
Patient transported to CT 

## 2022-08-07 NOTE — Consult Note (Addendum)
Cardiology Consultation   Patient ID: Candice Mclean MRN: [id number]; DOB: 07-23-1946  Admit date: 08/06/2022 Date of Consult: 08/07/2022  PCP:  Janene Madeira, Vernice Jefferson, MD   [facility] Providers Cardiologist:  None        Patient Profile:   Candice Mclean is a 76 y.o. female with a hx of pulmonary embolism 01/2016 on Coumadin, PAF, left renal artery organ embolism, brachiocephalic vein thrombosis, syncope and pauses s/p MDT PPM, DM, HTN, HLD, HFpEF, who is being seen 08/07/2022 for the evaluation of pulmonary hypertension at the request of Dr. Andria Frames.  History of Present Illness:   [name] normally gets all her care in [facility].  Some of the records are available in epic, but not all.  She has been managed for heart failure, and atrial fibrillation.  She has been on Coumadin.  She has had problems with nausea and vomiting, has not been able to keep her meds down for the last 2 days.  She has left-sided chest pain as well as abdominal pain.  There is diffuse tenderness to her left chest wall and abdomen.  The most tender area is the RUQ, then LUQ, then her chest.  There is no point tenderness and minimal abdominal guarding.  The symptoms occur in the setting of an upper respiratory illness as well as nausea and vomiting.  She has had multiple episodes of vomiting and retching over the last 2 days.   Past Medical History:  Diagnosis Date   AF (atrial fibrillation) (HCC)    CHF (congestive heart failure) ([place])    Coronary artery disease    Diabetes mellitus without complication [facility])    Medtronic pacemaker 2015    Past Surgical History:  Procedure Laterality Date   CARDIAC CATHETERIZATION  2013   No significant CAD     Home Medications:  Prior to Admission medications   Medication Sig Start Date End Date Taking? Authorizing Provider  acetaminophen (TYLENOL) 500 MG tablet Take 1 tablet (500 mg total) by mouth every 6 (six) hours as needed.  08/07/22  Yes Plunkett, Loree Fee, MD  albuterol (PROVENTIL HFA;VENTOLIN HFA) 108 (90 Base) MCG/ACT inhaler Inhale 2 puffs into the lungs every 2 (two) hours as needed for wheezing or shortness of breath. 01/10/17  Yes Elwin Mocha, MD  amLODipine (NORVASC) 10 MG tablet Take 10 mg by mouth daily. 07/12/22  Yes [provider]  aspirin EC 81 MG tablet Take 81 mg by mouth daily.   Yes [provider]  atorvastatin (LIPITOR) 80 MG tablet Take 80 mg by mouth daily.   Yes [provider]  furosemide (LASIX) 20 MG tablet Take 20 mg by mouth daily.   Yes [provider]  JARDIANCE 25 MG TABS tablet Take 25 mg by mouth daily. 05/28/22  Yes [provider]  latanoprost (XALATAN) 0.005 % ophthalmic solution Place 1 drop into both eyes at bedtime.   Yes [provider]  lisinopril (ZESTRIL) 40 MG tablet Take 40 mg by mouth daily. 05/28/22  Yes [provider]  magnesium oxide (MAG-OX) 400 (241.3 Mg) MG tablet Take 400-800 mg by mouth 2 (two) times daily. Two tablets in the morning and 1 tablet in the evening   Yes [provider]  metFORMIN (GLUCOPHAGE) 500 MG tablet Take 1,000 mg by mouth daily with breakfast.   Yes [provider]  metoprolol tartrate (LOPRESSOR) 25 MG tablet Take 25 mg by mouth 2 (two) times daily.   Yes [provider]  potassium chloride SA (KLOR-CON M) 20 MEQ tablet Take 20 mEq by mouth daily. 07/12/22  Yes [provider]  warfarin (COUMADIN) 5 MG tablet Take 5 mg by mouth See admin instructions. Take 5 mg by mouth daily except take 7.5 mg on Sunday, Monday, and Thursday.   Yes [provider]    Inpatient Medications: Scheduled Meds:  insulin aspart  0-9 Units Subcutaneous TID WC   [START ON 08/08/2022] pantoprazole (PROTONIX) IV  40 mg Intravenous Once   Continuous Infusions:  [START ON 08/08/2022] ceFEPime (MAXIPIME) IV     vancomycin     [START ON 08/08/2022] vancomycin     PRN  Meds: ipratropium-albuterol  Allergies:    Allergies  Allergen Reactions   Versed [Midazolam] Hives   Tylenol With Codeine #3 [Acetaminophen-Codeine] Palpitations    Social History:   Social History   Socioeconomic History   Marital status: Single    Spouse name: Not on file   Number of children: Not on file   Years of education: Not on file   Highest education level: Not on file  Occupational History   Not on file  Tobacco Use   Smoking status: Every Day   Smokeless tobacco: Never  Substance and Sexual Activity   Alcohol use: No   Drug use: Not on file   Sexual activity: Not on file  Other Topics Concern   Not on file  Social History Narrative   Not on file   Social Determinants of Health   Financial Resource Strain: Not on file  Food Insecurity: Not on file  Transportation Needs: Not on file  Physical Activity: Not on file  Stress: Not on file  Social Connections: Not on file  Intimate Partner Violence: Not on file    Family History:   Family History  Problem Relation Age of Onset   Heart disease Mother    Stroke Father      ROS:  Please see the history of present illness.  All other ROS reviewed and negative.     Physical Exam/Data:   Vitals:   08/07/22 1336 08/07/22 1337 08/07/22 1338 08/07/22 1435  BP:    139/81  Pulse:   87 87  Resp:   20 18  Temp: (!) 101 F (38.3 C)     TempSrc: Oral     SpO2:   94% 96%  Weight:  115.7 kg    Height:  _0  (1.575 m)     No intake or output data in the 24 hours ending 08/07/22 1925    08/07/2022    1:37 PM  Last 3 Weights  Weight (lbs) 255 lb  Weight (kg) 115.667 kg     Body mass index is 46.64 kg/m.  General:  Well nourished, well developed, in no acute distress HEENT: normal Neck: no JVD Vascular: No carotid bruits; Distal pulses 2+ bilaterally Cardiac:  normal S1, S2; RRR; 2/6 murmur  Lungs: Wheezing to auscultation bilaterally, no rhonchi or rales  Abd: soft, ++tender RUQ>LUQ, no  hepatomegaly  Ext: Trace edema Musculoskeletal:  No deformities, BUE and BLE strength weak but equal Skin: warm and dry  Neuro:  CNs 2-12 intact, no focal abnormalities noted Psych:  Normal affect   EKG:  The EKG was personally reviewed and demonstrates: Atrial fibrillation, heart rate 80, some lateral T wave changes from 2018, unclear significance Telemetry:  Telemetry was personally reviewed and demonstrates: Atrial fibrillation, heart rate generally controlled  Relevant CV Studies:  ECHO: 08/07/2022  1. Left ventricular ejection fraction, by estimation, is 55 to 60%. The  left ventricle has normal function. The left ventricle has no regional  wall motion abnormalities. Left ventricular diastolic parameters are  indeterminate.   2. Right ventricular systolic function is normal. The right ventricular  size is mildly enlarged. There is severely elevated pulmonary artery  systolic pressure. The estimated right ventricular systolic pressure is  49.8 mmHg.   3. Left atrial size was moderately dilated.   4. Right atrial size was moderately dilated.   5. The mitral valve is grossly normal. No evidence of mitral valve  regurgitation.   6. Appears RV lead is impacting coaptation of the tricuspid valve  leaflets. Tricuspid valve regurgitation is severe.   7. The aortic valve is tricuspid. Aortic valve regurgitation is not  visualized.   8. The inferior vena cava is dilated in size with <50% respiratory  variability, suggesting right atrial pressure of 15 mmHg.    CARDIAC CATH: 2013 No significant disease   REMOTE MONITORING ASSESSMENT Date of Transmission: June 16, 2022   Manufacturer of Device: Medtronic placed 06/2014 Placed for recurrent syncope, loop recorder with pauses up to 9 seconds, positive tilt table test  Type of Device: Dual Chamber Pacemaker  ______________________________________________________________________   See scanned/downloaded PDF report for model numbers,  serial numbers, and date(s) of implant.   Findings:   Presenting Rhythm: Vs ~90 bpm  Pacing Percentage: RV: 7.1%   Battery status: 10 months remaining   Lead trends: WNL   Events: AF listed 38% since 05/22/22, h/o atrial lead issues / oversensing, available episodes suggest combination of oversensing some some possible true AF. Rx'd Warfarin.  No ventricular events.   Please see downloaded PDF file of transmission under Media Tab for full details of device interrogation to include, when applicable, battery status/charge time, lead trend data, and programmed parameters.   Laboratory Data:  High Sensitivity Troponin:   Recent Labs  Lab 08/06/22 2100 08/07/22 0620  TROPONINIHS 10 8     Chemistry Recent Labs  Lab 08/06/22 2100  NA 139  K 4.4  CL 102  CO2 24  GLUCOSE 138*  BUN 24*  CREATININE 1.28*  CALCIUM 10.2  GFRNONAA 44*  ANIONGAP 13    Recent Labs  Lab 08/06/22 2100  PROT 9.0*  ALBUMIN 4.1  AST 26  ALT 20  ALKPHOS 82  BILITOT 1.3*   Lipids No results for input(s): "CHOL", "TRIG", "HDL", "LABVLDL", "LDLCALC", "CHOLHDL" in the last 168 hours.  Hematology Recent Labs  Lab 08/06/22 2100  WBC 6.0  RBC 4.56  HGB 12.8  HCT 37.5  MCV 82.2  MCH 28.1  MCHC 34.1  RDW 20.9*  PLT 221   Thyroid No results for input(s): "TSH", "FREET4" in the last 168 hours.  BNP Recent Labs  Lab 08/07/22 1008  BNP 136.0*    DDimer No results for input(s): "DDIMER" in the last 168 hours. Lab Results  Component Value Date   INR 1.95 01/10/2017     Radiology/Studies:  ECHOCARDIOGRAM COMPLETE  Result Date: 08/07/2022    ECHOCARDIOGRAM REPORT   Patient Name:   JAILYNNE OPPERMAN Date of Exam: 08/07/2022 Medical Rec #:  [id number]       Height:       62.0 in Accession #:    [id number]      Weight:       255.0 lb Date of Birth:  August 28, 1946      BSA:  2.120 m Patient Age:    22 years        BP:           139/81 mmHg Patient Gender: F               HR:           84  bpm. Exam Location:  Inpatient Procedure: 2D Echo STAT ECHO Indications:    acute diastolic chf  History:        Patient has no prior history of Echocardiogram examinations.                 Pacemaker, Arrythmias:Atrial Fibrillation; Risk                 Factors:Hypertension and Diabetes.  Sonographer:    Johny Chess RDCS Referring Phys: [place]  1. Left ventricular ejection fraction, by estimation, is 55 to 60%. The left ventricle has normal function. The left ventricle has no regional wall motion abnormalities. Left ventricular diastolic parameters are indeterminate.  2. Right ventricular systolic function is normal. The right ventricular size is mildly enlarged. There is severely elevated pulmonary artery systolic pressure. The estimated right ventricular systolic pressure is 11.0 mmHg.  3. Left atrial size was moderately dilated.  4. Right atrial size was moderately dilated.  5. The mitral valve is grossly normal. No evidence of mitral valve regurgitation.  6. Appears RV lead is impacting coaptation of the tricuspid valve leaflets. Tricuspid valve regurgitation is severe.  7. The aortic valve is tricuspid. Aortic valve regurgitation is not visualized.  8. The inferior vena cava is dilated in size with <50% respiratory variability, suggesting right atrial pressure of 15 mmHg. Comparison(s): No prior Echocardiogram. FINDINGS  Left Ventricle: Left ventricular ejection fraction, by estimation, is 55 to 60%. The left ventricle has normal function. The left ventricle has no regional wall motion abnormalities. The left ventricular internal cavity size was normal in size. There is  no left ventricular hypertrophy. Abnormal (paradoxical) septal motion, consistent with RV pacemaker. Left ventricular diastolic parameters are indeterminate. Right Ventricle: The right ventricular size is mildly enlarged. Right vetricular wall thickness was not well visualized. Right ventricular systolic  function is normal. There is severely elevated pulmonary artery systolic pressure. The tricuspid regurgitant velocity is 3.66 m/s, and with an assumed right atrial pressure of 15 mmHg, the estimated right ventricular systolic pressure is 31.5 mmHg. Left Atrium: Left atrial size was moderately dilated. Right Atrium: Right atrial size was moderately dilated. Pericardium: There is no evidence of pericardial effusion. Mitral Valve: The mitral valve is grossly normal. No evidence of mitral valve regurgitation. Tricuspid Valve: Appears RV lead is impacting coaptation of the tricuspid valve leaflets. The tricuspid valve is grossly normal. Tricuspid valve regurgitation is severe. Aortic Valve: The aortic valve is tricuspid. Aortic valve regurgitation is not visualized. Pulmonic Valve: The pulmonic valve was grossly normal. Pulmonic valve regurgitation is not visualized. Aorta: The aortic root and ascending aorta are structurally normal, with no evidence of dilitation. Venous: The inferior vena cava is dilated in size with less than 50% respiratory variability, suggesting right atrial pressure of 15 mmHg. IAS/Shunts: The interatrial septum was not well visualized. Additional Comments: A device lead is visualized.  LEFT VENTRICLE PLAX 2D LVIDd:         4.30 cm LVIDs:         3.10 cm LV PW:         0.90 cm LV IVS:  0.80 cm LVOT diam:     1.70 cm LVOT Area:     2.27 cm  RIGHT VENTRICLE             IVC RV S prime:     14.00 cm/s  IVC diam: 2.70 cm TAPSE (M-mode): 1.4 cm LEFT ATRIUM           Index        RIGHT ATRIUM           Index LA diam:      3.90 cm 1.84 cm/m   RA Area:     20.30 cm LA Vol (A2C): 58.9 ml 27.79 ml/m  RA Volume:   50.40 ml  23.78 ml/m LA Vol (A4C): 77.1 ml 36.37 ml/m   AORTA Ao Root diam: 2.70 cm Ao Asc diam:  3.40 cm TRICUSPID VALVE TR Peak grad:   53.6 mmHg TR Vmax:        366.00 cm/s  SHUNTS Systemic Diam: 1.70 cm Phineas Inches Electronically signed by Phineas Inches Signature Date/Time:  08/07/2022/5:18:26 PM    Final    CT ABDOMEN PELVIS W CONTRAST  Result Date: 08/07/2022 CLINICAL DATA:  Abdominal pain EXAM: CT ABDOMEN AND PELVIS WITH CONTRAST TECHNIQUE: Multidetector CT imaging of the abdomen and pelvis was performed using the standard protocol following bolus administration of intravenous contrast. RADIATION DOSE REDUCTION: This exam was performed according to the departmental dose-optimization program which includes automated exposure control, adjustment of the mA and/or kV according to patient size and/or use of iterative reconstruction technique. CONTRAST:  68m OMNIPAQUE IOHEXOL 350 MG/ML SOLN COMPARISON:  None Available. FINDINGS: Lower chest: There are patchy infiltrates in both lower lung fields. Heart is enlarged in size. Pacemaker leads are noted in place. Hepatobiliary: Liver measures 18.2 cm in length. No focal abnormalities are seen. There is no dilation of bile ducts. Pancreas: No focal abnormalities are seen. Spleen: Unremarkable. Adrenals/Urinary Tract: Adrenals are unremarkable. There is no hydronephrosis. Left kidney is much smaller than right. There are areas of cortical thinning in the left kidney. There are no renal or ureteral stones. There is no perinephric fluid collection. Urinary bladder is not distended. There is contrast in the urinary bladder lumen, most likely residual from previous intravenous contrast administration. Lower margin of the urinary bladder is extending below the pubic symphysis suggesting cystocele. Stomach/Bowel: Stomach is unremarkable. Small bowel loops are not dilated. Appendix is not dilated. There is no significant wall thickening in colon. There is no pericolic stranding. Vascular/Lymphatic: Scattered arterial calcifications are seen. Reproductive: Uterus is not seen. Other: There is no ascites or pneumoperitoneum. Right inguinal hernia containing fat is seen. Musculoskeletal: Degenerative changes are noted in right hip. IMPRESSION: There is  no evidence of intestinal obstruction or pneumoperitoneum. There is no hydronephrosis. Left kidney is much smaller than right which may be a congenital variation or suggest scarring from chronic pyelonephritis or renal artery stenosis. There are patchy infiltrates in both lower lung fields suggesting atelectasis/pneumonia. Cystocele in urinary bladder. Degenerative changes are noted in right hip. Other findings as described in the body of the report. Electronically Signed   By: PElmer PickerM.D.   On: 08/07/2022 14:51   CT Soft Tissue Neck W Contrast  Result Date: 08/06/2022 CLINICAL DATA:  Difficulty swallowing today EXAM: CT NECK WITH CONTRAST TECHNIQUE: Multidetector CT imaging of the neck was performed using the standard protocol following the bolus administration of intravenous contrast. RADIATION DOSE REDUCTION: This exam was performed according to the departmental dose-optimization  program which includes automated exposure control, adjustment of the mA and/or kV according to patient size and/or use of iterative reconstruction technique. CONTRAST:  7m OMNIPAQUE IOHEXOL 350 MG/ML SOLN COMPARISON:  None Available. FINDINGS: Pharynx and larynx: Narrowing of the left vallecula (series 3, image 46). No other mass or swelling. Salivary glands: No inflammation, mass, or stone. Thyroid: Normal. Lymph nodes: None enlarged or abnormal density. Vascular: Patent. Limited intracranial: Negative. Visualized orbits: Negative. Mastoids and visualized paranasal sinuses: Clear. Skeleton: Degenerative changes in the cervical spine. No acute osseous abnormality. Upper chest: Air trapping. No focal pulmonary opacity or pleural effusion. Other: None. IMPRESSION: 1. Narrowing of the left vallecula, which is nonspecific. Recommend direct visualization. 2. No additional acute process in the neck. Electronically Signed   By: AMerilyn BabaM.D.   On: 08/06/2022 23:23   DG Chest 2 View  Result Date: 08/06/2022 CLINICAL  DATA:  Cough EXAM: CHEST - 2 VIEW COMPARISON:  01/10/2017 FINDINGS: Left-sided pacing device as before. Cardiomegaly with vascular congestion. No acute consolidation, pleural effusion or pneumothorax. Aortic atherosclerosis IMPRESSION: Cardiomegaly with vascular congestion Electronically Signed   By: KDonavan FoilM.D.   On: 08/06/2022 21:47     Assessment and Plan:   Atrial fibrillation -Her download from August 2023 was reviewed, she has problems with the atrial lead oversensing, but was said to be in atrial fibrillation 38% of the time -Both atria are moderately dilated on echo today -We will not pursue sinus rhythm at this time as she would not maintain it with her acute illness -She has vomited up her medications for the last 2 days, so her INR will drop tomorrow for a time -Add heparin and restart Coumadin when she is able to keep medications down -Prior to admission she was on metoprolol 25 mg twice daily, can start IV metoprolol if rate control is needed and she is unable to take p.o.'s  2.  Pulmonary hypertension, TR -The echo was reviewed and it is accurate -She has multiple contributing factors to the [place] including obesity, respiratory difficulties with asthma and heart failure and possibly sleep apnea although the patient denies this -Discuss which medications we can use with MD  Otherwise, per IM   Risk Assessment/Risk Scores:       New York Heart Association (NYHA) Functional Class NYHA Class III  CHA2DS2-VASc Score = 8   This indicates a 10.8% annual risk of stroke. The patient's score is based upon: CHF History: 1 HTN History: 1 Diabetes History: 1 Stroke History: 2 Vascular Disease History: 1 Age Score: 1 Gender Score: 1   For questions or updates, please contact CMcCallPlease consult [email or web address] for contact info under    Signed, RRosaria Ferries PA-C  08/07/2022 7:25 PM  I have seen and examined the patient along with RRosaria Ferries  PA-C .  I have reviewed the chart, notes and new data.  I agree with PA/NP's note.  Key new complaints: [place] visiting her children and grandchildren.  Her first complaints were of swollen lymph glands in her neck, after which she developed subjective fever and shaking chills, followed by cough, nausea and intractable vomiting.  Her daughter and other family members have had a respiratory illness.  Self testing for COVID at home showed negative results both the patient and her family members.  Currently the patient has some chest discomfort, worsened by taking a deep breath, but not clearly  pleuritic.  She is most tender in the right upper quadrant left upper quadrant, after prolonged retching.  She has not had anginal chest pain.  She has a history of wheezing and asthma in the past, as well as a history of diastolic heart failure.  She is wheezing today.  She is unaware of palpitations.  She believes she has been in uninterrupted atrial fibrillation since April (her cardiologist recommended a cardioversion but she wanted to wait and see if it would resolve spontaneously; she does not think it ever did).  Her INR is usually therapeutic. Key examination changes: She looks uncomfortable, but does not appear to be in any acute distress.  She is lying fully supine in bed without any respiratory difficulty.  She is wheezing loudly in both lung fields.  Body habitus limits evaluation of the jugular veins, but she appears to have prominent V waves.  She has 2+ pitting edema of the lower extremities bilaterally.  Cardiovascular exam shows a regular rhythm and a holosystolic murmur heard at both the right and left lower sternal borders. Key new findings / data: Echocardiogram is reviewed.  She has excellent left ventricular systolic function and the left ventricle does not appear hypertrophic.  The right ventricle is at least moderately dilated but has normal  function.  There is severe tricuspid regurgitation and moderate to severe pulmonary artery hypertension.  At least in part the tricuspid regurgitation is related to the right ventricular lead stenting open the posterior leaflet of the tricuspid valve.  The inferior vena cava is dilated and the systolic flow reversal in the hepatic veins. INR is 1.95.  ECG shows atrial fibrillation with controlled ventricular response and fairly nonspecific inferolateral ST segment changes.  She has a history of normal coronary arteries by cardiac catheterization 10 years ago.  Unable to retrieve any echo reports or images from [facility]. She is not pacemaker dependent.  PLAN: I believe that most if not all of her cardiac issues are chronic and are not related to the acute illness (which most likely is a respiratory infection).   She has a history of diastolic heart failure, but does not appear to be in left heart failure today.  Her tricuspid regurgitation is partly related to pulmonary hypertension and partly related to mechanical interference from her ventricular pacemaker lead, which is not an uncommon occurrence.  She has probably been in atrial fibrillation for the last approximately 6 months.  The tricuspid regurgitation is so severe, that attempts to return her to normal rhythm long-term are likely to be futile. The cause of her pulmonary artery hypertension is likely to be multifactorial (possibly a contribution of diastolic left heart failure, previous history of pulmonary embolism, most important mechanisms unlikely to be morbid obesity and possible sleep disordered breathing).   There is evidence of chronic right heart failure, primarily due to the severity of tricuspid regurgitation. INR is borderline subtherapeutic and is likely to drop lower since she has been unable to hold down oral medications for the last 2 days. 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  Recommend: -Intravenous heparin until she is able to take oral medications and INR is greater than 2.0.  -Resume metoprolol for atrial fibrillation rate control when able to take oral medications.  At this point there is no indication for intravenous medications for rate control. -Once she is over the acute illness, recommend formal evaluation for  obstructive sleep apnea enthesis this can be done when she returns home). -As you are doing, hold diuretics, SGLT2 inhibitors and lisinopril until her renal function returns to baseline -Suspicion for infective endocarditis is not high.  There appear to be other reasons for her severe tricuspid regurgitation.  Nevertheless, if she has evidence of bacteremia with an organism that can cause endocarditis such as Staphylococcus aureus, may need further evaluation with transesophageal echocardiogram.  Sanda Klein, MD, [place] [phone] 08/07/2022, 7:28 PM

## 2022-08-07 NOTE — Sepsis Progress Note (Signed)
ELink tracking the Code Sepsis.

## 2022-08-08 ENCOUNTER — Encounter (HOSPITAL_COMMUNITY): Payer: Self-pay | Admitting: Student

## 2022-08-08 ENCOUNTER — Observation Stay (HOSPITAL_COMMUNITY): Payer: Medicare Other

## 2022-08-08 DIAGNOSIS — I2510 Atherosclerotic heart disease of native coronary artery without angina pectoris: Secondary | ICD-10-CM | POA: Diagnosis not present

## 2022-08-08 DIAGNOSIS — I270 Primary pulmonary hypertension: Secondary | ICD-10-CM | POA: Diagnosis not present

## 2022-08-08 DIAGNOSIS — R52 Pain, unspecified: Secondary | ICD-10-CM | POA: Diagnosis not present

## 2022-08-08 DIAGNOSIS — I5082 Biventricular heart failure: Secondary | ICD-10-CM | POA: Diagnosis present

## 2022-08-08 DIAGNOSIS — I253 Aneurysm of heart: Secondary | ICD-10-CM | POA: Diagnosis present

## 2022-08-08 DIAGNOSIS — E041 Nontoxic single thyroid nodule: Secondary | ICD-10-CM | POA: Diagnosis present

## 2022-08-08 DIAGNOSIS — I4821 Permanent atrial fibrillation: Secondary | ICD-10-CM | POA: Diagnosis present

## 2022-08-08 DIAGNOSIS — Z79899 Other long term (current) drug therapy: Secondary | ICD-10-CM | POA: Diagnosis not present

## 2022-08-08 DIAGNOSIS — J4520 Mild intermittent asthma, uncomplicated: Secondary | ICD-10-CM | POA: Diagnosis present

## 2022-08-08 DIAGNOSIS — J9811 Atelectasis: Secondary | ICD-10-CM | POA: Diagnosis present

## 2022-08-08 DIAGNOSIS — I5032 Chronic diastolic (congestive) heart failure: Secondary | ICD-10-CM | POA: Diagnosis not present

## 2022-08-08 DIAGNOSIS — J9601 Acute respiratory failure with hypoxia: Secondary | ICD-10-CM | POA: Diagnosis present

## 2022-08-08 DIAGNOSIS — I5031 Acute diastolic (congestive) heart failure: Secondary | ICD-10-CM | POA: Diagnosis not present

## 2022-08-08 DIAGNOSIS — Z86711 Personal history of pulmonary embolism: Secondary | ICD-10-CM | POA: Diagnosis not present

## 2022-08-08 DIAGNOSIS — E662 Morbid (severe) obesity with alveolar hypoventilation: Secondary | ICD-10-CM | POA: Diagnosis present

## 2022-08-08 DIAGNOSIS — R6510 Systemic inflammatory response syndrome (SIRS) of non-infectious origin without acute organ dysfunction: Secondary | ICD-10-CM | POA: Diagnosis present

## 2022-08-08 DIAGNOSIS — E119 Type 2 diabetes mellitus without complications: Secondary | ICD-10-CM | POA: Diagnosis present

## 2022-08-08 DIAGNOSIS — Q2112 Patent foramen ovale: Secondary | ICD-10-CM | POA: Diagnosis not present

## 2022-08-08 DIAGNOSIS — I2723 Pulmonary hypertension due to lung diseases and hypoxia: Secondary | ICD-10-CM | POA: Diagnosis present

## 2022-08-08 DIAGNOSIS — J189 Pneumonia, unspecified organism: Secondary | ICD-10-CM | POA: Diagnosis not present

## 2022-08-08 DIAGNOSIS — I5033 Acute on chronic diastolic (congestive) heart failure: Secondary | ICD-10-CM | POA: Diagnosis present

## 2022-08-08 DIAGNOSIS — J1289 Other viral pneumonia: Secondary | ICD-10-CM | POA: Diagnosis present

## 2022-08-08 DIAGNOSIS — Z86718 Personal history of other venous thrombosis and embolism: Secondary | ICD-10-CM | POA: Diagnosis not present

## 2022-08-08 DIAGNOSIS — I361 Nonrheumatic tricuspid (valve) insufficiency: Secondary | ICD-10-CM | POA: Diagnosis not present

## 2022-08-08 DIAGNOSIS — Z20822 Contact with and (suspected) exposure to covid-19: Secondary | ICD-10-CM | POA: Diagnosis present

## 2022-08-08 DIAGNOSIS — B9789 Other viral agents as the cause of diseases classified elsewhere: Secondary | ICD-10-CM | POA: Diagnosis present

## 2022-08-08 DIAGNOSIS — I2721 Secondary pulmonary arterial hypertension: Secondary | ICD-10-CM | POA: Diagnosis not present

## 2022-08-08 DIAGNOSIS — E876 Hypokalemia: Secondary | ICD-10-CM | POA: Diagnosis present

## 2022-08-08 DIAGNOSIS — I272 Pulmonary hypertension, unspecified: Secondary | ICD-10-CM | POA: Diagnosis not present

## 2022-08-08 DIAGNOSIS — R112 Nausea with vomiting, unspecified: Secondary | ICD-10-CM

## 2022-08-08 DIAGNOSIS — E785 Hyperlipidemia, unspecified: Secondary | ICD-10-CM | POA: Diagnosis present

## 2022-08-08 DIAGNOSIS — I495 Sick sinus syndrome: Secondary | ICD-10-CM | POA: Diagnosis present

## 2022-08-08 DIAGNOSIS — I071 Rheumatic tricuspid insufficiency: Secondary | ICD-10-CM | POA: Diagnosis present

## 2022-08-08 DIAGNOSIS — Z6841 Body Mass Index (BMI) 40.0 and over, adult: Secondary | ICD-10-CM | POA: Diagnosis not present

## 2022-08-08 DIAGNOSIS — R1110 Vomiting, unspecified: Secondary | ICD-10-CM | POA: Diagnosis not present

## 2022-08-08 DIAGNOSIS — M79601 Pain in right arm: Secondary | ICD-10-CM | POA: Diagnosis not present

## 2022-08-08 DIAGNOSIS — I482 Chronic atrial fibrillation, unspecified: Secondary | ICD-10-CM | POA: Diagnosis not present

## 2022-08-08 DIAGNOSIS — Q211 Atrial septal defect, unspecified: Secondary | ICD-10-CM | POA: Diagnosis not present

## 2022-08-08 DIAGNOSIS — I4891 Unspecified atrial fibrillation: Secondary | ICD-10-CM | POA: Diagnosis not present

## 2022-08-08 DIAGNOSIS — I110 Hypertensive heart disease with heart failure: Secondary | ICD-10-CM | POA: Diagnosis present

## 2022-08-08 LAB — CBC
HCT: 33.1 % — ABNORMAL LOW (ref 36.0–46.0)
Hemoglobin: 11 g/dL — ABNORMAL LOW (ref 12.0–15.0)
MCH: 27.9 pg (ref 26.0–34.0)
MCHC: 33.2 g/dL (ref 30.0–36.0)
MCV: 84 fL (ref 80.0–100.0)
Platelets: 190 10*3/uL (ref 150–400)
RBC: 3.94 MIL/uL (ref 3.87–5.11)
RDW: 21 % — ABNORMAL HIGH (ref 11.5–15.5)
WBC: 6.6 10*3/uL (ref 4.0–10.5)
nRBC: 0 % (ref 0.0–0.2)

## 2022-08-08 LAB — GLUCOSE, CAPILLARY
Glucose-Capillary: 159 mg/dL — ABNORMAL HIGH (ref 70–99)
Glucose-Capillary: 164 mg/dL — ABNORMAL HIGH (ref 70–99)

## 2022-08-08 LAB — HEMOGLOBIN A1C
Hgb A1c MFr Bld: 6.7 % — ABNORMAL HIGH (ref 4.8–5.6)
Mean Plasma Glucose: 145.59 mg/dL

## 2022-08-08 LAB — URINE CULTURE

## 2022-08-08 LAB — PROTIME-INR
INR: 2.5 — ABNORMAL HIGH (ref 0.8–1.2)
Prothrombin Time: 27.1 seconds — ABNORMAL HIGH (ref 11.4–15.2)

## 2022-08-08 LAB — PROCALCITONIN: Procalcitonin: 0.12 ng/mL

## 2022-08-08 LAB — CBG MONITORING, ED
Glucose-Capillary: 110 mg/dL — ABNORMAL HIGH (ref 70–99)
Glucose-Capillary: 126 mg/dL — ABNORMAL HIGH (ref 70–99)

## 2022-08-08 LAB — RESPIRATORY PANEL BY PCR

## 2022-08-08 LAB — MRSA NEXT GEN BY PCR, NASAL: MRSA by PCR Next Gen: NOT DETECTED

## 2022-08-08 LAB — BASIC METABOLIC PANEL
Anion gap: 14 (ref 5–15)
BUN: 14 mg/dL (ref 8–23)
CO2: 22 mmol/L (ref 22–32)
Calcium: 9.2 mg/dL (ref 8.9–10.3)
Chloride: 102 mmol/L (ref 98–111)
Creatinine, Ser: 1.11 mg/dL — ABNORMAL HIGH (ref 0.44–1.00)
GFR, Estimated: 52 mL/min — ABNORMAL LOW (ref >60)
Glucose, Bld: 128 mg/dL — ABNORMAL HIGH (ref 70–99)
Potassium: 3.9 mmol/L (ref 3.5–5.1)
Sodium: 138 mmol/L (ref 135–145)

## 2022-08-08 LAB — MAGNESIUM: Magnesium: 1.9 mg/dL (ref 1.7–2.4)

## 2022-08-08 MED ORDER — PANTOPRAZOLE SODIUM 40 MG IV SOLR
40.0000 mg | Freq: Two times a day (BID) | INTRAVENOUS | Status: DC
  Administered 2022-08-08 – 2022-08-11 (×7): 40 mg via INTRAVENOUS
  Filled 2022-08-08 (×7): qty 10

## 2022-08-08 MED ORDER — PHENOL 1.4 % MT LIQD
1.0000 | OROMUCOSAL | Status: DC | PRN
  Administered 2022-08-08: 1 via OROMUCOSAL
  Filled 2022-08-08: qty 177

## 2022-08-08 MED ORDER — FUROSEMIDE 10 MG/ML IJ SOLN
40.0000 mg | Freq: Two times a day (BID) | INTRAMUSCULAR | Status: DC
  Administered 2022-08-08 – 2022-08-10 (×5): 40 mg via INTRAVENOUS
  Filled 2022-08-08 (×6): qty 4

## 2022-08-08 MED ORDER — INFLUENZA VAC A&B SA ADJ QUAD 0.5 ML IM PRSY
0.5000 mL | PREFILLED_SYRINGE | INTRAMUSCULAR | Status: DC
  Filled 2022-08-08: qty 0.5

## 2022-08-08 MED ORDER — METOPROLOL TARTRATE 25 MG PO TABS
25.0000 mg | ORAL_TABLET | Freq: Two times a day (BID) | ORAL | Status: DC
  Administered 2022-08-08 – 2022-08-18 (×21): 25 mg via ORAL
  Filled 2022-08-08 (×21): qty 1

## 2022-08-08 MED ORDER — ACETAMINOPHEN 325 MG PO TABS
650.0000 mg | ORAL_TABLET | Freq: Four times a day (QID) | ORAL | Status: DC | PRN
  Administered 2022-08-08 – 2022-08-15 (×13): 650 mg via ORAL
  Filled 2022-08-08 (×13): qty 2

## 2022-08-08 MED ORDER — IPRATROPIUM-ALBUTEROL 0.5-2.5 (3) MG/3ML IN SOLN
3.0000 mL | Freq: Two times a day (BID) | RESPIRATORY_TRACT | Status: DC
  Administered 2022-08-10 – 2022-08-12 (×5): 3 mL via RESPIRATORY_TRACT
  Filled 2022-08-08 (×7): qty 3

## 2022-08-08 MED ORDER — SALINE SPRAY 0.65 % NA SOLN
1.0000 | NASAL | Status: DC | PRN
  Administered 2022-08-08: 1 via NASAL
  Filled 2022-08-08 (×2): qty 44

## 2022-08-08 MED ORDER — LIP MEDEX EX OINT
TOPICAL_OINTMENT | CUTANEOUS | Status: DC | PRN
  Administered 2022-08-08: 75 via TOPICAL
  Filled 2022-08-08 (×2): qty 7

## 2022-08-08 MED ORDER — SPIRONOLACTONE 12.5 MG HALF TABLET
12.5000 mg | ORAL_TABLET | Freq: Every day | ORAL | Status: DC
  Administered 2022-08-08 – 2022-08-11 (×4): 12.5 mg via ORAL
  Filled 2022-08-08 (×6): qty 1

## 2022-08-08 MED ORDER — IOHEXOL 350 MG/ML SOLN
75.0000 mL | Freq: Once | INTRAVENOUS | Status: AC | PRN
  Administered 2022-08-08: 75 mL via INTRAVENOUS

## 2022-08-08 NOTE — Consult Note (Addendum)
Consultation  Referring Provider: [facility]/ [facility] Primary Care Physician:  Janene Madeira, Vernice Jefferson, MD Primary Gastroenterologist:  none/ unassigned  Reason for Consultation:    HPI: [name] is a 76 y.o. female, in [place] visiting family, resides in [place].  Patient has history of hypertension, diabetes mellitus, history of prior PE, on Coumadin, atrial fibrillation, coronary artery disease and is status post pacemaker.  She presented to the emergency room yesterday with complaints of left-sided chest soreness over the past several days, burning in nature, several episodes of nausea and vomiting over the previous couple of days with retching.  She tells me that the nausea and vomiting started Wednesday.  She was not aware of any fever or chills at home until she presented to the emergency room yesterday.  No diarrhea.  She complained of generalized abdominal soreness more so in the right upper abdomen.  We were consulted there was concern that she had choked on something which precipitated nausea vomiting and retching.  On history this morning she denies any choking or difficulty swallowing has not had any prior GI issues.  She says she started having a lot of mucus and coughing on Wednesday as well.  She did bring up some blood-streaked material which she says now she thinks was from her throat with coughing. Work-up in the ER yesterday with chest x-ray-showed cardiomegaly with vascular congestion  CT soft tissue neck-show some narrowing of the left vallecula otherwise negative CT of the abdomen pelvis shows patchy infiltrates both lower lung fields enlarged heart, pacemaker leads, liver 18 cm, no ascites obstruction or pneumoperitoneum  X-ray last p.m. showing new infiltrates in the right lower lung edema versus infection  She had desaturation last night, requiring high flow oxygen, she underwent CT angio of the chest negative for PE, changes noted consistent with  pulmonary arterial hypertension and some degree of right heart failure on the dense collapse and consolidation basal segments of the lobes bilaterally significant volume loss and airspace infiltrate particularly in the left lower lobe.  Findings suggesting multifocal air trapping related to small airway disease  Labs-on admit WBC 6.0/hemoglobin 12.8/hematocrit 37.5-today WBC 6.6 hemoglobin 11/hematocrit 33.1 Lactic acid 1.1 BC  Pending BNP 136, troponin within normal limits COVID-negative BUN 24/creatinine 1.28 LFTs within normal limits  Today- pro time 27/INR 2.5  Patient denies any abdominal pain, says she is just sore  no further nausea and vomiting since admission.  Febrile to 101 yesterday afternoon, temp in the 99 range since  Cardiology consultation last night guarding pulmonary hypertension which is felt to be multifactorial.  She has evidence of chronic right heart failure is not felt to be in acute congestive heart failure currently Felt that most of her cardiac axes were chronic and acute illness likely secondary to respiratory infection.   Past Medical History:  Diagnosis Date   AF (atrial fibrillation) (HCC)    CHF (congestive heart failure) ([place])    Coronary artery disease    Diabetes mellitus without complication [facility])    Medtronic pacemaker 2015    Past Surgical History:  Procedure Laterality Date   CARDIAC CATHETERIZATION  2013   No significant CAD    Prior to Admission medications   Medication Sig Start Date End Date Taking? Authorizing Provider  acetaminophen (TYLENOL) 500 MG tablet Take 1 tablet (500 mg total) by mouth every 6 (six) hours as needed. 08/07/22  Yes Blanchie Dessert, MD  albuterol (PROVENTIL HFA;VENTOLIN HFA) 108 (90 Base) MCG/ACT inhaler  Inhale 2 puffs into the lungs every 2 (two) hours as needed for wheezing or shortness of breath. 01/10/17  Yes Elwin Mocha, MD  amLODipine (NORVASC) 10 MG tablet Take 10 mg by mouth daily. 07/12/22  Yes  [provider]  aspirin EC 81 MG tablet Take 81 mg by mouth daily.   Yes [provider]  atorvastatin (LIPITOR) 80 MG tablet Take 80 mg by mouth daily.   Yes [provider]  furosemide (LASIX) 20 MG tablet Take 20 mg by mouth daily.   Yes [provider]  JARDIANCE 25 MG TABS tablet Take 25 mg by mouth daily. 05/28/22  Yes [provider]  latanoprost (XALATAN) 0.005 % ophthalmic solution Place 1 drop into both eyes at bedtime.   Yes [provider]  lisinopril (ZESTRIL) 40 MG tablet Take 40 mg by mouth daily. 05/28/22  Yes [provider]  magnesium oxide (MAG-OX) 400 (241.3 Mg) MG tablet Take 400-800 mg by mouth 2 (two) times daily. Two tablets in the morning and 1 tablet in the evening   Yes [provider]  metFORMIN (GLUCOPHAGE) 500 MG tablet Take 1,000 mg by mouth daily with breakfast.   Yes [provider]  metoprolol tartrate (LOPRESSOR) 25 MG tablet Take 25 mg by mouth 2 (two) times daily.   Yes [provider]  potassium chloride SA (KLOR-CON M) 20 MEQ tablet Take 20 mEq by mouth daily. 07/12/22  Yes [provider]  warfarin (COUMADIN) 5 MG tablet Take 5 mg by mouth See admin instructions. Take 5 mg by mouth daily except take 7.5 mg on Sunday, Monday, and Thursday.   Yes [provider]    Current Facility-Administered Medications  Medication Dose Route Frequency Provider Last Rate Last Admin   acetaminophen (TYLENOL) suppository 650 mg  650 mg Rectal Q6H PRN Darci Current, DO       ceFEPIme (MAXIPIME) 2 g in sodium chloride 0.9 % 100 mL IVPB  2 g Intravenous Q12H Bertis Ruddy, RPH   Stopped at 08/08/22 0531   furosemide (LASIX) injection 40 mg  40 mg Intravenous BID Geralynn Rile, MD   40 mg at 08/08/22 0855   insulin aspart (novoLOG) injection 0-9 Units  0-9 Units Subcutaneous TID WC Maxwell, Allee, MD       ipratropium-albuterol (DUONEB) 0.5-2.5 (3) MG/3ML nebulizer  solution 3 mL  3 mL Nebulization Q4H Ganta, Anupa, DO   3 mL at 08/08/22 0855   metoprolol tartrate (LOPRESSOR) tablet 25 mg  25 mg Oral BID Geralynn Rile, MD       pantoprazole (PROTONIX) injection 40 mg  40 mg Intravenous Once Erskine Emery, MD       spironolactone (ALDACTONE) tablet 12.5 mg  12.5 mg Oral Daily O'Neal, Cassie Freer, MD       vancomycin (VANCOREADY) IVPB 750 mg/150 mL  750 mg Intravenous Q24H Bertis Ruddy, [facility]       Current Outpatient Medications  Medication Sig Dispense Refill   acetaminophen (TYLENOL) 500 MG tablet Take 1 tablet (500 mg total) by mouth every 6 (six) hours as needed. 30 tablet 0   albuterol (PROVENTIL HFA;VENTOLIN HFA) 108 (90 Base) MCG/ACT inhaler Inhale 2 puffs into the lungs every 2 (two) hours as needed for wheezing or shortness of breath. 1 Inhaler 0   amLODipine (NORVASC) 10 MG tablet Take 10 mg by mouth daily.     aspirin EC 81 MG tablet Take 81 mg by mouth daily.  atorvastatin (LIPITOR) 80 MG tablet Take 80 mg by mouth daily.     furosemide (LASIX) 20 MG tablet Take 20 mg by mouth daily.     JARDIANCE 25 MG TABS tablet Take 25 mg by mouth daily.     latanoprost (XALATAN) 0.005 % ophthalmic solution Place 1 drop into both eyes at bedtime.     lisinopril (ZESTRIL) 40 MG tablet Take 40 mg by mouth daily.     magnesium oxide (MAG-OX) 400 (241.3 Mg) MG tablet Take 400-800 mg by mouth 2 (two) times daily. Two tablets in the morning and 1 tablet in the evening     metFORMIN (GLUCOPHAGE) 500 MG tablet Take 1,000 mg by mouth daily with breakfast.     metoprolol tartrate (LOPRESSOR) 25 MG tablet Take 25 mg by mouth 2 (two) times daily.     potassium chloride SA (KLOR-CON M) 20 MEQ tablet Take 20 mEq by mouth daily.     warfarin (COUMADIN) 5 MG tablet Take 5 mg by mouth See admin instructions. Take 5 mg by mouth daily except take 7.5 mg on Sunday, Monday, and Thursday.      Allergies as of 08/06/2022 - Review Complete 08/06/2022  Allergen  Reaction Noted   Versed [midazolam] Hives 01/10/2017   Tylenol with codeine #3 [acetaminophen-codeine] Palpitations 01/10/2017    Family History  Problem Relation Age of Onset   Heart disease Mother    Stroke Father     Social History   Socioeconomic History   Marital status: Single    Spouse name: Not on file   Number of children: Not on file   Years of education: Not on file   Highest education level: Not on file  Occupational History   Not on file  Tobacco Use   Smoking status: Every Day   Smokeless tobacco: Never  Substance and Sexual Activity   Alcohol use: No   Drug use: Not on file   Sexual activity: Not on file  Other Topics Concern   Not on file  Social History Narrative   Not on file   Social Determinants of Health   Financial Resource Strain: Not on file  Food Insecurity: Not on file  Transportation Needs: Not on file  Physical Activity: Not on file  Stress: Not on file  Social Connections: Not on file  Intimate Partner Violence: Not on file    Review of Systems: Pertinent positive and negative review of systems were noted in the above HPI section.  All other review of systems was otherwise negative.   Physical Exam: Vital signs in last 24 hours: Temp:  [99 F (37.2 C)-101 F (38.3 C)] 99.1 F (37.3 C) (09/29 0830) Pulse Rate:  [78-111] 103 (09/29 0830) Resp:  [18-39] 24 (09/29 0830) BP: (118-164)/(66-91) 157/86 (09/29 0830) SpO2:  [91 %-98 %] 97 % (09/29 0830) Weight:  [115.7 kg] 115.7 kg (09/28 1337)   General:   Alert,  Well-developed, obese elderly African-American female pleasant and cooperative in NAD-breathing comfortably on high flow nasal oxygen Head:  Normocephalic and atraumatic. Eyes:  Sclera clear, no icterus.   Conjunctiva pink. Ears:  Normal auditory acuity. Nose:  No deformity, discharge,  or lesions. Mouth:  No deformity or lesions.   Neck:  Supple; no masses or thyromegaly. Lungs: Decreased breath sounds bilateral bases   heart:  Regular rate and rhythm; no murmurs, clicks, rubs,  or gallops. Abdomen:  Soft, obese, bowel sounds are present, she has some tenderness across the upper abdomen and  is tender along the costal margins bilaterally right greater than left Rectal:  not done Msk:  Symmetrical without gross deformities. . Pulses:  Normal pulses noted. Extremities:  Without clubbing or edema. Neurologic:  Alert and  oriented x4;  grossly normal neurologically. Skin:  Intact without significant lesions or rashes.. Psych:  Alert and cooperative. Normal mood and affect.  Intake/Output from previous day: 09/28 0701 - 09/29 0700 In: 100 [IV Piggyback:100] Out: -  Intake/Output this shift: No intake/output data recorded.  Lab Results: Recent Labs    08/06/22 2100 08/08/22 0317  WBC 6.0 6.6  HGB 12.8 11.0*  HCT 37.5 33.1*  PLT 221 190   BMET Recent Labs    08/06/22 2100 08/08/22 0317  NA 139 138  K 4.4 3.9  CL 102 102  CO2 24 22  GLUCOSE 138* 128*  BUN 24* 14  CREATININE 1.28* 1.11*  CALCIUM 10.2 9.2   LFT Recent Labs    08/06/22 2100  PROT 9.0*  ALBUMIN 4.1  AST 26  ALT 20  ALKPHOS 82  BILITOT 1.3*   PT/INR Recent Labs    08/08/22 0317  LABPROT 27.1*  INR 2.5*   Hepatitis Panel No results for input(s): "HEPBSAG", "HCVAB", "HEPAIGM", "HEPBIGM" in the last 72 hours.    IMPRESSION:  #73 76 year old African-American female admitted yesterday with 2 to 3-day history of illness with nausea and vomiting, cough, retching.  Episode of either blood-streaked emesis or sputum, isolated. With initial notes there was concern for soreness in the throat or difficulty swallowing however at present patient denies any issues with dysphagia or odynophagia, she says she did not have any episode of choking on any food bolus and just became ill with nausea vomiting coughing and sputum production. has not had any overt evidence of bleeding since admission, no further nausea or vomiting CT of  the chest, and CT abdomen pelvis without any acute GI findings  She was found to have left lower lobe infiltrate/atelectasis and now on chest x-ray also has right lower lobe infiltrate. Febrile after arrival in the emergency room to 101 Desaturated last night and now requiring high flow oxygen  Currently is more consistent with an acute respiratory illness with bilateral infiltrates/pneumonia   Not convinced that she aspirated or that she had a choking event,but need to consider   #2 very pulmonary artery hypertension-moderate right ventricular dilation and severe tricuspid regurgitation  #3 prior history of PE #4 atrial fibrillation #5 chronic anticoagulation/Coumadin #6 morbid obesity #7.  Diabetes mellitus   PLAN: Start clear liquids, advance as tolerated No role for endoscopic intervention at present If patient complains of any further symptoms of dysphagia, would consider barium swallow when she is more stable from a cardiopulmonary standpoint Cover with PPI   Per Cardiology, anticoagulation is to be continued, starting heparin and holding Coumadin   Amy Esterwood PA-C 08/08/2022, 8:58 AM  I have taken an interval history, thoroughly reviewed the chart and examined the patient. I agree with the Advanced Practitioner's note, impression and recommendations, and have recorded additional findings, impressions and recommendations below. I performed a substantive portion of this encounter (>50% time spent), including a complete performance of the medical decision making.  My additional thoughts are as follows:  Case discussed with our APP before and after my evaluation of this patient.  Extensive chart review performed.  I spoke with Ms. Felber along with her family member at the bedside.  She denies prehospital chronic dysphagia, she had acute onset of  vomiting the day prior to admission which led to probable aspiration.  The vomiting has stopped, and she had a residual sore  throat which has now significantly improved.  She has had solid food today without reporting difficulty swallowing.  Results of reportedly some blood streaked material when she was coughing.  Overall, this patient does not appear to have dysphagia or GI bleeding.  No current plans for endoscopic testing.  If she should describe dysphagia, please get a barium study as she is high risk for endoscopic procedures.  GI service signing off, please call as needed.  Nelida Meuse III [phone]

## 2022-08-08 NOTE — Progress Notes (Signed)
ANTICOAGULATION CONSULT NOTE - Initial Consult  Pharmacy Consult for Heparin Indication: atrial fibrillation  Allergies  Allergen Reactions   Versed [Midazolam] Hives   Tylenol With Codeine #3 [Acetaminophen-Codeine] Palpitations    Patient Measurements: Height: _0  (157.5 cm) Weight: 115.7 kg (255 lb) IBW/kg (Calculated) : 50.1 Heparin Dosing Weight: 78.5 kg  Vital Signs: Temp: 99.1 F (37.3 C) (09/29 0830) Temp Source: Oral (09/29 0830) BP: 155/95 (09/29 1016) Pulse Rate: 105 (09/29 1016)  Labs: Recent Labs    08/06/22 2100 08/07/22 0620 08/08/22 0317  HGB 12.8  --  11.0*  HCT 37.5  --  33.1*  PLT 221  --  190  LABPROT  --   --  27.1*  INR  --   --  2.5*  CREATININE 1.28*  --  1.11*  TROPONINIHS 10 8  --     Estimated Creatinine Clearance: 54.4 mL/min (A) (by C-G formula based on SCr of 1.11 mg/dL (H)).   Medical History: Past Medical History:  Diagnosis Date   AF (atrial fibrillation) (HCC)    CHF (congestive heart failure) ([place])    Coronary artery disease    Diabetes mellitus without complication ([place])    Medtronic pacemaker 2015    Medications:  See med rec Noted pt on warfarin  Assessment: ER is a 75YOF requiring anticoagulation for A fib. Pharmacy was consulted to initiate heparin when INR <2. Pt takes warfarin 7.5 mg on Sun/Mon/Thurs and 5 mg on all other days (42.5 mg/week), though has not taken warfarin over past 2 days. INR is currently therapeutic at 2.5 for goal 2-3. Hgb has down trended from 12.8>11.0 over past two days, will monitor. Plt are wnl. Speech has cleared pt for PO meds, though pt may undergo RHC to evaluate severe pulmonary HTN during this hospitalization making heparin the preferred anticoagulation strategy vs restarting home warfarin. Will continue to follow cardiology and pulmonology recs. Goal of Therapy:  Heparin level 0.3-0.7 units/ml Monitor platelets by anticoagulation protocol: Yes   Plan:  Delay initiation of heparin  until INR <2 Recheck INR on 9/30 AM to reevaluate Monitor daily CBC   Park Liter, PharmD Candidate 08/08/2022,11:42 AM

## 2022-08-08 NOTE — Progress Notes (Signed)
Physical Therapy Evaluation Patient Details Name: Candice Mclean MRN: [id number] DOB: 1946-02-21 Today's Date: 08/08/2022  History of Present Illness  76 y.o. female presenting with sudden onset dysphagia with associated vomiting and subsequently developed SOB. PMHx first degree AVB, thyroid nodule, asthma, renal artery embolism, syncope, pAF, and SSS s/p Medtronic dual chamber pacemaker.  Clinical Impression  Pt was seen to get up to side of bed after sitting and tolerating that activity.  Pt is demonstrating poor standing tolerance on 15L having dropped to 83% sat and becoming pale with the effort bedside.  Quickly returned her to bed and discussed rehab needs with she and her son.  Follow along with her to get back to PLOF as soon as possible, with SNF recommended due to extent of her deficits, and her wish to return home with family to assist.  Encourage OOB to chair and monitor HR/sats with all activity.  If pt is going home directly from hospital will need to practice steps to enter house.     Recommendations for follow up therapy are one component of a multi-disciplinary discharge planning process, led by the attending physician.  Recommendations may be updated based on patient status, additional functional criteria and insurance authorization.  Follow Up Recommendations Skilled nursing-short term rehab (<3 hours/day) Can patient physically be transported by private vehicle: No    Assistance Recommended at Discharge Frequent or constant Supervision/Assistance  Patient can return home with the following  A lot of help with walking and/or transfers;A little help with bathing/dressing/bathroom;Assistance with cooking/housework;Direct supervision/assist for medications management;Direct supervision/assist for financial management;Assist for transportation;Help with stairs or ramp for entrance    Equipment Recommendations None recommended by PT  Recommendations for Other Services        Functional Status Assessment Patient has had a recent decline in their functional status and demonstrates the ability to make significant improvements in function in a reasonable and predictable amount of time.     Precautions / Restrictions Precautions Precautions: Fall Precaution Comments: watch O2 sats and HR Restrictions Weight Bearing Restrictions: No      Mobility  Bed Mobility Overal bed mobility: Needs Assistance Bed Mobility: Supine to Sit, Sit to Supine   Sidelying to sit: Mod assist Supine to sit: Mod assist Sit to supine: Mod assist        Transfers Overall transfer level: Needs assistance Equipment used: 2 person hand held assist Transfers: Sit to/from Stand Sit to Stand: Min assist, +2 physical assistance, +2 safety/equipment           General transfer comment: rapidly faituged on 15 L O2, became pale and required sudden assist to sti    Ambulation/Gait               General Gait Details: deferred  Stairs            Wheelchair Mobility    Modified Rankin (Stroke Patients Only)       Balance Overall balance assessment: Needs assistance Sitting-balance support: Feet supported Sitting balance-Leahy Scale: Fair     Standing balance support: Bilateral upper extremity supported, During functional activity Standing balance-Leahy Scale: Fair Standing balance comment: less than fair as pt stood longer with sats down to 83%                             Pertinent Vitals/Pain Pain Assessment Pain Assessment: Faces Faces Pain Scale: Hurts a little bit Pain Location: R elbow Pain Descriptors /  Indicators: Guarding Pain Intervention(s): Monitored during session, Repositioned    Home Living Family/patient expects to be discharged to:: Private residence Living Arrangements: Alone Available Help at Discharge: Family;Available 24 hours/day Type of Home: House Home Access: Stairs to enter Entrance Stairs-Rails:  Right Entrance Stairs-Number of Steps: 4   Home Layout: One level Home Equipment: Conservation officer, nature (2 wheels);Cane - single point      Prior Function Prior Level of Function : Independent/Modified Independent;Driving             Mobility Comments: drove and did her own housework       Hand Dominance   Dominant Hand: Right    Extremity/Trunk Assessment   Upper Extremity Assessment Upper Extremity Assessment: Defer to OT evaluation    Lower Extremity Assessment Lower Extremity Assessment: Generalized weakness    Cervical / Trunk Assessment Cervical / Trunk Assessment: Normal  Communication   Communication: No difficulties  Cognition Arousal/Alertness: Awake/alert Behavior During Therapy: Flat affect Overall Cognitive Status: Within Functional Limits for tasks assessed                                 General Comments: answering questions but is very weak        General Comments General comments (skin integrity, edema, etc.): pt rapidly desaturated on 15 L as she did only bedside standing    Exercises     Assessment/Plan    PT Assessment Patient needs continued PT services  PT Problem List Cardiopulmonary status limiting activity;Decreased strength;Decreased balance;Decreased activity tolerance       PT Treatment Interventions DME instruction;Gait training;Stair training;Functional mobility training;Therapeutic exercise;Therapeutic activities;Balance training;Neuromuscular re-education;Patient/family education    PT Goals (Current goals can be found in the Care Plan section)  Acute Rehab PT Goals Patient Stated Goal: none stated PT Goal Formulation: With family Time For Goal Achievement: 08/22/22 Potential to Achieve Goals: Good    Frequency Min 3X/week     Co-evaluation               AM-PAC PT "6 Clicks" Mobility  Outcome Measure Help needed turning from your back to your side while in a flat bed without using bedrails?: A  Lot Help needed moving from lying on your back to sitting on the side of a flat bed without using bedrails?: A Lot Help needed moving to and from a bed to a chair (including a wheelchair)?: A Lot Help needed standing up from a chair using your arms (e.g., wheelchair or bedside chair)?: A Lot Help needed to walk in hospital room?: Total Help needed climbing 3-5 steps with a railing? : Total 6 Click Score: 10    End of Session Equipment Utilized During Treatment: Oxygen Activity Tolerance: Treatment limited secondary to medical complications (Comment) Patient left: in bed;with call bell/phone within reach;with family/visitor present Nurse Communication: Mobility status;Other (comment) (tolerance for standing) PT Visit Diagnosis: Unsteadiness on feet (R26.81);Other abnormalities of gait and mobility (R26.89);Muscle weakness (generalized) (M62.81);Difficulty in walking, not elsewhere classified (R26.2)    Time: 9292-4462 PT Time Calculation (min) (ACUTE ONLY): 22 min   Charges:   PT Evaluation $PT Eval Moderate Complexity: 1 Mod         Ramond Dial 08/08/2022, 4:27 PM  Mee Hives, PT PhD Acute Rehab Dept. Number: [place] and [place]

## 2022-08-08 NOTE — Assessment & Plan Note (Addendum)
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 No surgical intervention at this time per heart failure.  Primary team provided clear medical indication for BiPAP DME. PFTs with severe obstructive pattern. -Follow-up with outpatient for OSA -BiPAP at discharge -Pulmonary to reassess as family would like to discuss with them

## 2022-08-08 NOTE — Progress Notes (Signed)
FMTS Interim Progress Note  S: Resting comfortably with son and daughter at bedside. Continues to be on 14L HFNC. O2 sats well maintained. Denies worsening SOB or chest pain. Assessed with Dr. Larae Grooms.   O: BP 131/87 (BP Location: Left Wrist)   Pulse 97   Temp 99.8 F (37.7 C) (Oral)   Resp 15   Ht _0  (1.575 m)   Wt 115.7 kg   SpO2 98%   BMI 46.64 kg/m   Ill-appearing, no acute distress  Cardio: regular rate, irregular rhythm.   A/P: - Wean O2 tomorrow  - repeat CXR for increased work of breathing overnight - Cont antibiotics    Darci Current, DO 08/08/2022, 8:08 PM PGY-1, [facility] Service pager [phone]

## 2022-08-08 NOTE — Consult Note (Addendum)
NAME:  Candice Mclean, MRN:  [id number], DOB:  [birth year], LOS: 0 ADMISSION DATE:  08/06/2022, CONSULTATION DATE:  08/08/22 REFERRING MD:  Graciella Freer, CHIEF COMPLAINT:  hypoxia   History of Present Illness:  Candice Mclean is a 77 y.o. 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  She states that she had a mildly productive cough with some chills for several days prior and one sick contact.    In the ED, she was hypoxic to upper 80%'s requiring [place] oxygen, Covid negative,  CT chest with patchy infiltrates suggestive of pneumonia and febrile to 101F.  She was given initial Vanc and Cefepime with IVF and admitted to family medicine team.    Overnight, she had an episode of desaturation and required 15L HFNC, CTA chest negative for PE.  Cardiology was consulted when echo showed signs of severe PAH and tricuspid regurgitation and Lasix 29m bid initiation, consider RH cath. Troponins were trended and flat.   PCCM consulted in this setting   Pt reports a history of asthma, only uses albuterol for this and no recent severe exacerbations.  She has never been a smoker and does not use oxygen at home. Never been diagnosed with COPD or OSA  Pertinent  Medical History   has a past medical history of [place], [place] ([place]estive heart [place]) [place], Coronary artery disease, Diabetes [place] complication [place], and Medtronic pacemaker (2015).   Significant Hospital Events: Including procedures, antibiotic start and stop dates in addition to other pertinent events   9/28 admitted for pneumonia to [facility] 9/29 hypoxic event, PCCM consult, on 15L [place].  CTA negative for PE  Interim History / Subjective:  Pt stable without distress on HFNC Viral panel + for rhinocirus  Objective   Blood pressure (!) 155/95, pulse (!) 105,  temperature 99.1 F (37.3 C), temperature source Oral, resp. rate (!) 22, height _0  (1.575 m), weight 115.7 kg, SpO2 95 %.        Intake/Output Summary (Last 24 hours) at 08/08/2022 1215 Last data filed at 08/08/2022 0531 Gross per 24 hour  Intake 100 ml  Output --  Net 100 ml   Filed Weights   08/07/22 1337  Weight: 115.7 kg    General:  well-nourished elderly F, resting in bed in no distress HEENT: MM pink/moist, sclera anicteric  Neuro: fatigued but awake and alert, oriented and non-focal CV: s1s2 rrr, no m/r/g PULM:  decreased air entry in bilateral bases, no significant wheezing or rhonchi GI: soft, non-tender Extremities: warm/dry, trace pre-tibial edema  Skin: no rashes or lesions   Resolved Hospital Problem list     Assessment & Plan:    Acute Hypoxic Respiratory Failure likely secondary to Viral pneumonia with likely superimposed aspiration pneumonitis and PAH History of Asthma Viral panel + for rhinovirus and CT chest with patchy infiltrates Echo with preserved EF and severe tricuspid regurgitation Procal 0.12 -Cardiology following and are diuresing, considering RHC -oxygen requirement has been stable today -received one dose Cefepime and Vancomycin, would continue Cefepime  -check respiratory culture, urine strep and legionella  -Incentive spirometer -she is not currently wheezing, continue prn duonebs.  If worsening consider steroids for severe pneumonia -Pt is stable for admission to telemetry, if she is worsening please contact PCCM -pt confirms full code and that she would want intubation/ventilator if  needed -rest of management per primary team    Best Practice (right click and "Reselect all SmartList Selections" daily)   Per primary  Labs   CBC: Recent Labs  Lab 08/06/22 2100 08/08/22 0317  WBC 6.0 6.6  NEUTROABS 4.2  --   HGB 12.8 11.0*  HCT 37.5 33.1*  MCV 82.2 84.0  PLT 221 973    Basic Metabolic Panel: Recent Labs  Lab  08/06/22 2100 08/08/22 0317  NA 139 138  K 4.4 3.9  CL 102 102  CO2 24 22  GLUCOSE 138* 128*  BUN 24* 14  CREATININE 1.28* 1.11*  CALCIUM 10.2 9.2  MG  --  1.9   GFR: Estimated Creatinine Clearance: 54.4 mL/min (A) (by C-G formula based on SCr of 1.11 mg/dL (H)). Recent Labs  Lab 08/06/22 2100 08/07/22 1450 08/08/22 0317  PROCALCITON  --   --  0.12  WBC 6.0  --  6.6  LATICACIDVEN  --  1.1  --     Liver Function Tests: Recent Labs  Lab 08/06/22 2100  AST 26  ALT 20  ALKPHOS 82  BILITOT 1.3*  PROT 9.0*  ALBUMIN 4.1   Recent Labs  Lab 08/06/22 2100  LIPASE 41   No results for input(s): "AMMONIA" in the last 168 hours.  ABG No results found for: "PHART", "PCO2ART", "PO2ART", "HCO3", "TCO2", "ACIDBASEDEF", "O2SAT"   Coagulation Profile: Recent Labs  Lab 08/08/22 0317  INR 2.5*    Cardiac Enzymes: No results for input(s): "CKTOTAL", "CKMB", "CKMBINDEX", "TROPONINI" in the last 168 hours.  HbA1C: Hgb A1c MFr Bld  Date/Time Value Ref Range Status  08/08/2022 03:17 AM 6.7 (H) 4.8 - 5.6 % Final    Comment:    (NOTE) Pre diabetes:          5.7%-6.4%  Diabetes:              >6.4%  Glycemic control for   <7.0% adults with diabetes     CBG: Recent Labs  Lab 08/07/22 2217 08/08/22 0755 08/08/22 1153  GLUCAP 134* 110* 126*    Review of Systems:   Please see the history of present illness. All other systems reviewed and are negative    Past Medical History:  She,  has a past medical history of AF (atrial fibrillation) ([place]), CHF (congestive heart failure) ([place]), Coronary artery disease, Diabetes mellitus without complication ([place]), and Medtronic pacemaker (2015).   Surgical History:   Past Surgical History:  Procedure Laterality Date   CARDIAC CATHETERIZATION  2013   No significant CAD     Social History:   reports that she has been smoking. She has never used smokeless tobacco. She reports that she does not drink alcohol.   Family  History:  Her family history includes Heart disease in her mother; Stroke in her father.   Allergies Allergies  Allergen Reactions   Versed [Midazolam] Hives   Tylenol With Codeine #3 [Acetaminophen-Codeine] Palpitations     Home Medications  Prior to Admission medications   Medication Sig Start Date End Date Taking? Authorizing Provider  acetaminophen (TYLENOL) 500 MG tablet Take 1 tablet (500 mg total) by mouth every 6 (six) hours as needed. 08/07/22  Yes Plunkett, Loree Fee, MD  albuterol (PROVENTIL HFA;VENTOLIN HFA) 108 (90 Base) MCG/ACT inhaler Inhale 2 puffs into the lungs every 2 (two) hours as needed for wheezing or shortness of breath. 01/10/17  Yes Elwin Mocha, MD  amLODipine (NORVASC) 10 MG tablet Take 10 mg by mouth daily.  07/12/22  Yes [provider]  aspirin EC 81 MG tablet Take 81 mg by mouth daily.   Yes [provider]  atorvastatin (LIPITOR) 80 MG tablet Take 80 mg by mouth daily.   Yes [provider]  furosemide (LASIX) 20 MG tablet Take 20 mg by mouth daily.   Yes [provider]  JARDIANCE 25 MG TABS tablet Take 25 mg by mouth daily. 05/28/22  Yes [provider]  latanoprost (XALATAN) 0.005 % ophthalmic solution Place 1 drop into both eyes at bedtime.   Yes [provider]  lisinopril (ZESTRIL) 40 MG tablet Take 40 mg by mouth daily. 05/28/22  Yes [provider]  magnesium oxide (MAG-OX) 400 (241.3 Mg) MG tablet Take 400-800 mg by mouth 2 (two) times daily. Two tablets in the morning and 1 tablet in the evening   Yes [provider]  metFORMIN (GLUCOPHAGE) 500 MG tablet Take 1,000 mg by mouth daily with breakfast.   Yes [provider]  metoprolol tartrate (LOPRESSOR) 25 MG tablet Take 25 mg by mouth 2 (two) times daily.   Yes [provider]  potassium chloride SA (KLOR-CON M) 20 MEQ tablet Take 20 mEq by mouth daily. 07/12/22  Yes [provider]  warfarin (COUMADIN) 5 MG  tablet Take 5 mg by mouth See admin instructions. Take 5 mg by mouth daily except take 7.5 mg on Sunday, Monday, and Thursday.   Yes [provider]     Critical care time: n/a    Otilio Carpen Zea Kostka, PA-C [facility] See Amion for pager If no response to pager , please call [phone] until 7pm After 7:00 pm call Elink  [phone]?[place]

## 2022-08-08 NOTE — Evaluation (Signed)
Occupational Therapy Evaluation Patient Details Name: Candice Mclean MRN: [id number] DOB: 1945-12-03 Today's Date: 08/08/2022   History of Present Illness 76 y.o. female presenting with sudden onset dysphagia with associated vomiting and subsequently developed SOB. PMHx first degree AVB, thyroid nodule, asthma, renal artery embolism, syncope, pAF, and SSS s/p Medtronic dual chamber pacemaker.   Clinical Impression   Patient admitted for the diagnosis above.  PTA she lives alone, continues to drive and needed no assist with ADL, or iADL.  Patient used a SPC at baseline.  Primary deficit is poor activity tolerance and O2 requirements, 15 L of HFNC.  Patient easily desaturates and becomes very short of breath with minimal activity.  Currently needing Min to Mod A for ADL completion and basic transfers.  OT will follow in the acute setting, and SNF will be recommended for now, but [facility] with [facility] is a possibility if her activity tolerance improves.        Recommendations for follow up therapy are one component of a multi-disciplinary discharge planning process, led by the attending physician.  Recommendations may be updated based on patient status, additional functional criteria and insurance authorization.   Follow Up Recommendations  Skilled nursing-short term rehab (<3 hours/day)    Assistance Recommended at Discharge Frequent or constant Supervision/Assistance  Patient can return home with the following A lot of help with bathing/dressing/bathroom;A lot of help with walking and/or transfers;Assistance with cooking/housework;Assist for transportation;Help with stairs or ramp for entrance    Functional Status Assessment  Patient has had a recent decline in their functional status and demonstrates the ability to make significant improvements in function in a reasonable and predictable amount of time.  Equipment Recommendations  Tub/shower seat    Recommendations for Other Services        Precautions / Restrictions Precautions Precautions: Fall Precaution Comments: watch O2 sats and HR Restrictions Weight Bearing Restrictions: No      Mobility Bed Mobility Overal bed mobility: Needs Assistance Bed Mobility: Sidelying to Sit, Sit to Sidelying   Sidelying to sit: Mod assist     Sit to sidelying: Mod assist General bed mobility comments: assist with feet and scooting Patient Response: Cooperative  Transfers Overall transfer level: Needs assistance   Transfers: Sit to/from Stand Sit to Stand: Min assist                  Balance Overall balance assessment: Needs assistance Sitting-balance support: Feet unsupported Sitting balance-Leahy Scale: Fair     Standing balance support: Single extremity supported Standing balance-Leahy Scale: Fair                             ADL either performed or assessed with clinical judgement   ADL Overall ADL's : Needs assistance/impaired     Grooming: Wash/dry hands;Wash/dry face;Set up;Sitting           Upper Body Dressing : Minimal assistance;Sitting   Lower Body Dressing: Moderate assistance;Sit to/from stand   Toilet Transfer: Minimal assistance;BSC/3in1;Stand-pivot                   Vision Patient Visual Report: No change from baseline       Perception Perception Perception: Within Functional Limits   Praxis Praxis Praxis: Intact    Pertinent Vitals/Pain Pain Assessment Pain Assessment: Faces Faces Pain Scale: Hurts little more Pain Location: R elbow Pain Descriptors / Indicators: Sore, Grimacing Pain Intervention(s): Monitored during session  Hand Dominance Right   Extremity/Trunk Assessment Upper Extremity Assessment Upper Extremity Assessment: Generalized weakness   Lower Extremity Assessment Lower Extremity Assessment: Defer to PT evaluation   Cervical / Trunk Assessment Cervical / Trunk Assessment: Normal   Communication Communication Communication:  No difficulties   Cognition Arousal/Alertness: Awake/alert Behavior During Therapy: Flat affect Overall Cognitive Status: Within Functional Limits for tasks assessed                                       General Comments   15L HFNC, and desat'd to 87% with bed mobility.      Exercises     Shoulder Instructions      Home Living Family/patient expects to be discharged to:: Private residence Living Arrangements: Alone Available Help at Discharge: Family;Available 24 hours/day Type of Home: House Home Access: Stairs to enter [employer] of Steps: 4   Home Layout: One level     Bathroom Shower/Tub: Tub/shower unit;Walk-in shower   Bathroom Toilet: Standard Bathroom Accessibility: Yes How Accessible: Accessible via walker Home Equipment: [place] (2 wheels);Cane - single point          Prior Functioning/Environment Prior Level of Function : Independent/Modified Independent;Driving                        OT Problem List: Impaired balance (sitting and/or standing);Decreased activity tolerance;Decreased strength      OT Treatment/Interventions: Self-care/ADL training;Therapeutic activities;Patient/family education;DME and/or AE instruction;Balance training;Energy conservation    OT Goals(Current goals can be found in the care plan section) Acute Rehab OT Goals Patient Stated Goal: Would like to return home OT Goal Formulation: With patient Time For Goal Achievement: 08/22/22 Potential to Achieve Goals: Good ADL Goals Pt Will Perform Grooming: with supervision;standing Pt Will Perform Lower Body Dressing: with supervision;sit to/from stand Pt Will Transfer to Toilet: with supervision;ambulating;regular height toilet  OT Frequency: Min 2X/week    Co-evaluation              AM-PAC OT "6 Clicks" Daily Activity     Outcome Measure Help from another person eating meals?: None Help from another person taking care of personal  grooming?: None Help from another person toileting, which includes using toliet, bedpan, or urinal?: A Little Help from another person bathing (including washing, rinsing, drying)?: A Lot Help from another person to put on and taking off regular upper body clothing?: A Little Help from another person to put on and taking off regular lower body clothing?: A Lot 6 Click Score: 18   End of Session Equipment Utilized During Treatment: Oxygen Nurse Communication: Mobility status  Activity Tolerance: Patient limited by fatigue Patient left: in bed;with call bell/phone within reach;with family/visitor present  OT Visit Diagnosis: Unsteadiness on feet (R26.81);Muscle weakness (generalized) (M62.81)                Time: 1610-9604 OT Time Calculation (min): 15 min Charges:  OT General Charges $OT Visit: 1 Visit OT Evaluation $OT Eval Moderate Complexity: 1 Mod  08/08/2022  RP, OTR/L  Acute Rehabilitation Services  Office:  [phone]   [name] 08/08/2022, 11:54 AM

## 2022-08-08 NOTE — Progress Notes (Signed)
FMTS Interim Progress Note  CTA-PE negative for PE, findings suggestive of possible left lower lobe infection.  Continue broad-spectrum antibiotics.  May be able to narrow antibiotics tomorrow for CAP coverage.  Assessed with Dr. Larae Grooms.   Darci Current, DO 08/08/2022, 1:03 AM PGY-1, [facility] pager [phone]

## 2022-08-08 NOTE — Progress Notes (Addendum)
Cardiology Progress Note  Patient ID: Candice Mclean MRN: [id number] DOB: 05/25/1946 Date of Encounter: 08/08/2022  Primary Cardiologist: Sanda Klein, MD  Subjective   Chief Complaint: SOB  HPI: Remains quite short of breath.  Appears volume up.  Pulmonary hypertension is a working diagnosis.  ROS:  All other ROS reviewed and negative. Pertinent positives noted in the HPI.     Inpatient Medications  Scheduled Meds:  furosemide  40 mg Intravenous BID   insulin aspart  0-9 Units Subcutaneous TID WC   ipratropium-albuterol  3 mL Nebulization Q4H   pantoprazole (PROTONIX) IV  40 mg Intravenous Once   Continuous Infusions:  ceFEPime (MAXIPIME) IV Stopped (08/08/22 0531)   vancomycin     PRN Meds: acetaminophen   Vital Signs   Vitals:   08/08/22 0715 08/08/22 0730 08/08/22 0745 08/08/22 0800  BP: 137/84 (!) 142/84 (!) 159/87 (!) 164/79  Pulse: 94 99 97 (!) 103  Resp: (!) 21 (!) 27 (!) 27 (!) 25  Temp:      TempSrc:      SpO2: 97% 95% 92% 91%  Weight:      Height:        Intake/Output Summary (Last 24 hours) at 08/08/2022 0823 Last data filed at 08/08/2022 0531 Gross per 24 hour  Intake 100 ml  Output --  Net 100 ml      08/07/2022    1:37 PM  Last 3 Weights  Weight (lbs) 255 lb  Weight (kg) 115.667 kg      Telemetry  Overnight telemetry shows A-fib heart rate 90s, which I personally reviewed.   ECG  The most recent ECG shows A-fib heart rate 101, nonspecific ST-T changes, which I personally reviewed.   Physical Exam   Vitals:   08/08/22 0715 08/08/22 0730 08/08/22 0745 08/08/22 0800  BP: 137/84 (!) 142/84 (!) 159/87 (!) 164/79  Pulse: 94 99 97 (!) 103  Resp: (!) 21 (!) 27 (!) 27 (!) 25  Temp:      TempSrc:      SpO2: 97% 95% 92% 91%  Weight:      Height:        Intake/Output Summary (Last 24 hours) at 08/08/2022 0823 Last data filed at 08/08/2022 0531 Gross per 24 hour  Intake 100 ml  Output --  Net 100 ml       08/07/2022    1:37 PM   Last 3 Weights  Weight (lbs) 255 lb  Weight (kg) 115.667 kg    Body mass index is 46.64 kg/m.  General: Well nourished, well developed, in no acute distress Head: Atraumatic, normal size  Eyes: PEERLA, EOMI  Neck: Supple, JVD 12 to 15 cm of water Endocrine: No thryomegaly Cardiac: Normal S1, S2; irregular rhythm Lungs: Clear to auscultation bilaterally, no wheezing, rhonchi or rales  Abd: Soft, nontender, no hepatomegaly  Ext: No edema, pulses 2+ Musculoskeletal: No deformities, BUE and BLE strength normal and equal Skin: Warm and dry, no rashes   Neuro: Alert and oriented to person, place, time, and situation, CNII-XII grossly intact, no focal deficits  Psych: Normal mood and affect   Labs  High Sensitivity Troponin:   Recent Labs  Lab 08/06/22 2100 08/07/22 0620  TROPONINIHS 10 8     Cardiac EnzymesNo results for input(s): "TROPONINI" in the last 168 hours. No results for input(s): "TROPIPOC" in the last 168 hours.  Chemistry Recent Labs  Lab 08/06/22 2100 08/08/22 0317  NA 139 138  K 4.4 3.9  CL 102 102  CO2 24 22  GLUCOSE 138* 128*  BUN 24* 14  CREATININE 1.28* 1.11*  CALCIUM 10.2 9.2  PROT 9.0*  --   ALBUMIN 4.1  --   AST 26  --   ALT 20  --   ALKPHOS 82  --   BILITOT 1.3*  --   GFRNONAA 44* 52*  ANIONGAP 13 14    Hematology Recent Labs  Lab 08/06/22 2100 08/08/22 0317  WBC 6.0 6.6  RBC 4.56 3.94  HGB 12.8 11.0*  HCT 37.5 33.1*  MCV 82.2 84.0  MCH 28.1 27.9  MCHC 34.1 33.2  RDW 20.9* 21.0*  PLT 221 190   BNP Recent Labs  Lab 08/07/22 1008  BNP 136.0*    DDimer No results for input(s): "DDIMER" in the last 168 hours.   Radiology  CT Angio Chest Pulmonary Embolism (PE) W or WO Contrast  Result Date: 08/08/2022 CLINICAL DATA:  Chest pain or SOB, pleurisy or effusion suspected. Hypoxia EXAM: CT ANGIOGRAPHY CHEST WITH CONTRAST TECHNIQUE: Multidetector CT imaging of the chest was performed using the standard protocol during bolus  administration of intravenous contrast. Multiplanar CT image reconstructions and MIPs were obtained to evaluate the vascular anatomy. RADIATION DOSE REDUCTION: This exam was performed according to the departmental dose-optimization program which includes automated exposure control, adjustment of the mA and/or kV according to patient size and/or use of iterative reconstruction technique. CONTRAST:  77m OMNIPAQUE IOHEXOL 350 MG/ML SOLN COMPARISON:  None Available. FINDINGS: Cardiovascular: There is adequate opacification of the pulmonary arterial tree. No intraluminal filling defect identified to suggest acute pulmonary embolism. The central pulmonary arteries are enlarged in keeping with changes of pulmonary arterial hypertension. Mild coronary artery calcification. Mild global cardiomegaly. There is particular enlargement of the right heart with reflux of contrast into the hepatic venous system and intrahepatic inferior vena cava, which appear dilated, in keeping with changes of at least some degree of right heart failure. No pericardial effusion. Mild atherosclerotic calcification within the thoracic aorta. No aortic aneurysm. Left subclavian dual lead pacemaker is in place with leads within the right atrium and right ventricle. Mediastinum/Nodes: Visualized thyroid is unremarkable. No pathologic thoracic adenopathy. Esophagus is unremarkable. Small hiatal hernia. Lungs/Pleura: There is dense collapse and consolidation of the basal segments of the lower lobes bilaterally. Significant volume loss favors this is simply represent atelectasis, however, airspace infiltrate is identified, particularly within the left lower lobe and a superimposed infectious process is difficult to exclude. There is mosaic attenuation within the aerated portion of the lungs bilaterally suggesting multifocal air trapping related to small airways disease. No pneumothorax. No pleural effusion. No central obstructing lesion. Upper Abdomen:  No acute abnormality. Musculoskeletal: No chest wall abnormality. No acute or significant osseous findings. Review of the MIP images confirms the above findings. IMPRESSION: 1. No pulmonary embolism. 2. Morphologic changes in keeping with pulmonary arterial hypertension and at least some degree of right heart failure. Mild global cardiomegaly. Mild coronary artery calcification. 3. Dense collapse and consolidation of the basal segments of the lower lobes bilaterally. Significant volume loss suggests this largely represents atelectasis, however, airspace infiltrate is identified, particularly within the left lower lobe and a superimposed infectious process is difficult to exclude. 4. Mosaic attenuation within the aerated portion of the lungs bilaterally suggesting multifocal air trapping related to small airways disease. Aortic Atherosclerosis (ICD10-I70.0). Electronically Signed   By: AFidela SalisburyM.D.   On: 08/08/2022 00:45   DG CHEST PORT 1 VIEW  Result  Date: 08/07/2022 CLINICAL DATA:  Shortness of breath EXAM: PORTABLE CHEST 1 VIEW COMPARISON:  Radiographs 08/06/2022 FINDINGS: Increased hazy airspace opacity in the right lower lung since 08/06/2022. Pulmonary vascular congestion. Possible small right pleural effusion stable cardiomegaly. Aortic atherosclerotic calcification. Left chest wall pacemaker. IMPRESSION: New infiltrates in the right lower lung favored to represent edema though infection is not excluded. Cardiomegaly and pulmonary vascular congestion. Electronically Signed   By: Placido Sou M.D.   On: 08/07/2022 23:03   ECHOCARDIOGRAM COMPLETE  Result Date: 08/07/2022    ECHOCARDIOGRAM REPORT   Patient Name:   Candice Mclean Date of Exam: 08/07/2022 Medical Rec #:  [id number]       Height:       62.0 in Accession #:    [id number]      Weight:       255.0 lb Date of Birth:  09/15/1946      BSA:          2.120 m Patient Age:    76 years        BP:           139/81 mmHg Patient Gender: F                HR:           84 bpm. Exam Location:  Inpatient Procedure: 2D Echo STAT ECHO Indications:    acute diastolic chf  History:        Patient has no prior history of Echocardiogram examinations.                 Pacemaker, Arrythmias:Atrial Fibrillation; Risk                 Factors:Hypertension and Diabetes.  Sonographer:    Johny Chess RDCS Referring Phys: [place]  1. Left ventricular ejection fraction, by estimation, is 55 to 60%. The left ventricle has normal function. The left ventricle has no regional wall motion abnormalities. Left ventricular diastolic parameters are indeterminate.  2. Right ventricular systolic function is normal. The right ventricular size is mildly enlarged. There is severely elevated pulmonary artery systolic pressure. The estimated right ventricular systolic pressure is 42.6 mmHg.  3. Left atrial size was moderately dilated.  4. Right atrial size was moderately dilated.  5. The mitral valve is grossly normal. No evidence of mitral valve regurgitation.  6. Appears RV lead is impacting coaptation of the tricuspid valve leaflets. Tricuspid valve regurgitation is severe.  7. The aortic valve is tricuspid. Aortic valve regurgitation is not visualized.  8. The inferior vena cava is dilated in size with <50% respiratory variability, suggesting right atrial pressure of 15 mmHg. Comparison(s): No prior Echocardiogram. FINDINGS  Left Ventricle: Left ventricular ejection fraction, by estimation, is 55 to 60%. The left ventricle has normal function. The left ventricle has no regional wall motion abnormalities. The left ventricular internal cavity size was normal in size. There is  no left ventricular hypertrophy. Abnormal (paradoxical) septal motion, consistent with RV pacemaker. Left ventricular diastolic parameters are indeterminate. Right Ventricle: The right ventricular size is mildly enlarged. Right vetricular wall thickness was not well visualized. Right  ventricular systolic function is normal. There is severely elevated pulmonary artery systolic pressure. The tricuspid regurgitant velocity is 3.66 m/s, and with an assumed right atrial pressure of 15 mmHg, the estimated right ventricular systolic pressure is 83.4 mmHg. Left Atrium: Left atrial size was moderately dilated. Right Atrium: Right atrial size was moderately dilated. Pericardium:  There is no evidence of pericardial effusion. Mitral Valve: The mitral valve is grossly normal. No evidence of mitral valve regurgitation. Tricuspid Valve: Appears RV lead is impacting coaptation of the tricuspid valve leaflets. The tricuspid valve is grossly normal. Tricuspid valve regurgitation is severe. Aortic Valve: The aortic valve is tricuspid. Aortic valve regurgitation is not visualized. Pulmonic Valve: The pulmonic valve was grossly normal. Pulmonic valve regurgitation is not visualized. Aorta: The aortic root and ascending aorta are structurally normal, with no evidence of dilitation. Venous: The inferior vena cava is dilated in size with less than 50% respiratory variability, suggesting right atrial pressure of 15 mmHg. IAS/Shunts: The interatrial septum was not well visualized. Additional Comments: A device lead is visualized.  LEFT VENTRICLE PLAX 2D LVIDd:         4.30 cm LVIDs:         3.10 cm LV PW:         0.90 cm LV IVS:        0.80 cm LVOT diam:     1.70 cm LVOT Area:     2.27 cm  RIGHT VENTRICLE             IVC RV S prime:     14.00 cm/s  IVC diam: 2.70 cm TAPSE (M-mode): 1.4 cm LEFT ATRIUM           Index        RIGHT ATRIUM           Index LA diam:      3.90 cm 1.84 cm/m   RA Area:     20.30 cm LA Vol (A2C): 58.9 ml 27.79 ml/m  RA Volume:   50.40 ml  23.78 ml/m LA Vol (A4C): 77.1 ml 36.37 ml/m   AORTA Ao Root diam: 2.70 cm Ao Asc diam:  3.40 cm TRICUSPID VALVE TR Peak grad:   53.6 mmHg TR Vmax:        366.00 cm/s  SHUNTS Systemic Diam: 1.70 cm Phineas Inches Electronically signed by Phineas Inches Signature  Date/Time: 08/07/2022/5:18:26 PM    Final    CT ABDOMEN PELVIS W CONTRAST  Result Date: 08/07/2022 CLINICAL DATA:  Abdominal pain EXAM: CT ABDOMEN AND PELVIS WITH CONTRAST TECHNIQUE: Multidetector CT imaging of the abdomen and pelvis was performed using the standard protocol following bolus administration of intravenous contrast. RADIATION DOSE REDUCTION: This exam was performed according to the departmental dose-optimization program which includes automated exposure control, adjustment of the mA and/or kV according to patient size and/or use of iterative reconstruction technique. CONTRAST:  61m OMNIPAQUE IOHEXOL 350 MG/ML SOLN COMPARISON:  None Available. FINDINGS: Lower chest: There are patchy infiltrates in both lower lung fields. Heart is enlarged in size. Pacemaker leads are noted in place. Hepatobiliary: Liver measures 18.2 cm in length. No focal abnormalities are seen. There is no dilation of bile ducts. Pancreas: No focal abnormalities are seen. Spleen: Unremarkable. Adrenals/Urinary Tract: Adrenals are unremarkable. There is no hydronephrosis. Left kidney is much smaller than right. There are areas of cortical thinning in the left kidney. There are no renal or ureteral stones. There is no perinephric fluid collection. Urinary bladder is not distended. There is contrast in the urinary bladder lumen, most likely residual from previous intravenous contrast administration. Lower margin of the urinary bladder is extending below the pubic symphysis suggesting cystocele. Stomach/Bowel: Stomach is unremarkable. Small bowel loops are not dilated. Appendix is not dilated. There is no significant wall thickening in colon. There is no pericolic stranding.  Vascular/Lymphatic: Scattered arterial calcifications are seen. Reproductive: Uterus is not seen. Other: There is no ascites or pneumoperitoneum. Right inguinal hernia containing fat is seen. Musculoskeletal: Degenerative changes are noted in right hip.  IMPRESSION: There is no evidence of intestinal obstruction or pneumoperitoneum. There is no hydronephrosis. Left kidney is much smaller than right which may be a congenital variation or suggest scarring from chronic pyelonephritis or renal artery stenosis. There are patchy infiltrates in both lower lung fields suggesting atelectasis/pneumonia. Cystocele in urinary bladder. Degenerative changes are noted in right hip. Other findings as described in the body of the report. Electronically Signed   By: Elmer Picker M.D.   On: 08/07/2022 14:51   CT Soft Tissue Neck W Contrast  Result Date: 08/06/2022 CLINICAL DATA:  Difficulty swallowing today EXAM: CT NECK WITH CONTRAST TECHNIQUE: Multidetector CT imaging of the neck was performed using the standard protocol following the bolus administration of intravenous contrast. RADIATION DOSE REDUCTION: This exam was performed according to the departmental dose-optimization program which includes automated exposure control, adjustment of the mA and/or kV according to patient size and/or use of iterative reconstruction technique. CONTRAST:  2m OMNIPAQUE IOHEXOL 350 MG/ML SOLN COMPARISON:  None Available. FINDINGS: Pharynx and larynx: Narrowing of the left vallecula (series 3, image 46). No other mass or swelling. Salivary glands: No inflammation, mass, or stone. Thyroid: Normal. Lymph nodes: None enlarged or abnormal density. Vascular: Patent. Limited intracranial: Negative. Visualized orbits: Negative. Mastoids and visualized paranasal sinuses: Clear. Skeleton: Degenerative changes in the cervical spine. No acute osseous abnormality. Upper chest: Air trapping. No focal pulmonary opacity or pleural effusion. Other: None. IMPRESSION: 1. Narrowing of the left vallecula, which is nonspecific. Recommend direct visualization. 2. No additional acute process in the neck. Electronically Signed   By: AMerilyn BabaM.D.   On: 08/06/2022 23:23   DG Chest 2 View  Result Date:  08/06/2022 CLINICAL DATA:  Cough EXAM: CHEST - 2 VIEW COMPARISON:  01/10/2017 FINDINGS: Left-sided pacing device as before. Cardiomegaly with vascular congestion. No acute consolidation, pleural effusion or pneumothorax. Aortic atherosclerosis IMPRESSION: Cardiomegaly with vascular congestion Electronically Signed   By: KDonavan FoilM.D.   On: 08/06/2022 21:47    Cardiac Studies  TTE 08/07/2022  1. Left ventricular ejection fraction, by estimation, is 55 to 60%. The  left ventricle has normal function. The left ventricle has no regional  wall motion abnormalities. Left ventricular diastolic parameters are  indeterminate.   2. Right ventricular systolic function is normal. The right ventricular  size is mildly enlarged. There is severely elevated pulmonary artery  systolic pressure. The estimated right ventricular systolic pressure is  646.2mmHg.   3. Left atrial size was moderately dilated.   4. Right atrial size was moderately dilated.   5. The mitral valve is grossly normal. No evidence of mitral valve  regurgitation.   6. Appears RV lead is impacting coaptation of the tricuspid valve  leaflets. Tricuspid valve regurgitation is severe.   7. The aortic valve is tricuspid. Aortic valve regurgitation is not  visualized.   8. The inferior vena cava is dilated in size with <50% respiratory  variability, suggesting right atrial pressure of 15 mmHg.   Patient Profile  Candice Mclean a 76y.o. female with atrial fibrillation status post pacemaker, morbid obesity, asthma who was admitted on 08/07/2022 for dysphagia and vomiting.  Cardiology was consulted for echocardiogram which shows severe pulmonary hypertension and RV dysfunction.  Assessment & Plan   #Acute hypoxic respiratory failure #  Severe pulm hypertension with moderate RV dilation and dysfunction #Severe tricuspid regurgitation #PNA? -She presents with acute onset of dysphagia and nausea.  She also was found to have hypoxic  respiratory failure.  Her echocardiogram is interesting in that she has severe pulm hypertension.  No acute PE.  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  Possible pneumonia as well. -She has elevated JVD.  She has some element of right heart failure on my examination.  Would recommend we start Lasix 40 mg IV twice daily to see if this helps her.  She could be experiencing hepatic congestion this could explain some of her symptoms. -I also start Aldactone 12.5 mg daily to help with her potassium. -Unclear how much of this is acute versus chronic.  Her RV clearly looks like more of a chronic issue. -I am not convinced she has pneumonia.  We will check procalcitonin's. -Her oxygen requirement is quite significant compared to what was seen on her CT. -She could have undiagnosed sleep apnea.  She may have underlying lung disease.  This could also just represent primary pulm hypertension of some undisclosed etiology.  I think pulmonary consultation would be beneficial.  I suspect she will end up needing a left and right heart catheterization this admission if she continues to have low oxygen levels.  She has not grossly volume overloaded and has no evidence of pulmonary edema. -Would recommend diuresis over the weekend.  Would put her on a heparin drip and hold Coumadin.  Start her heparin drip once her INR is below 2. -We will check procalcitonin as well.  #Atrial fibrillation, permanent -Hold Coumadin.  Transition heparin once INR is below 2.  Rate seems to be well controlled. -Restart home metoprolol    For questions or updates, please contact [place] Please consult [email or web address] for contact info under   Signed, [place] T. Audie Box, MD, [place]  08/08/2022 8:23 AM

## 2022-08-08 NOTE — Plan of Care (Signed)
  Problem: Education: Goal: Ability to describe self-care measures that may prevent or decrease complications (Diabetes Survival Skills Education) will improve Outcome: Progressing Goal: Individualized Educational Video(s) Outcome: Progressing

## 2022-08-08 NOTE — Evaluation (Addendum)
Clinical/Bedside Swallow Evaluation Patient Details  Name: Candice Mclean MRN: [id number] Date of Birth: 1946/01/08  Today's Date: 08/08/2022 Time: SLP Start Time (ACUTE ONLY): 1033 SLP Stop Time (ACUTE ONLY): 3244 SLP Time Calculation (min) (ACUTE ONLY): 7 min  Past Medical History:  Past Medical History:  Diagnosis Date   AF (atrial fibrillation) (HCC)    CHF (congestive heart failure) ([place])    Coronary artery disease    Diabetes mellitus without complication [facility])    Medtronic pacemaker 2015   Past Surgical History:  Past Surgical History:  Procedure Laterality Date   CARDIAC CATHETERIZATION  2013   No significant CAD   HPI:  Candice Mclean is a 76 y.o. female presenting with sudden onset dysphagia with associated vomiting and subsequently developed SOB.  CXR 9/28: "New infiltrates in the right lower lung favored to represent edema though infection is not excluded"  Chest CT 9/29: "Dense collapse and consolidation of the basal segments of the lower lobes bilaterally. Significant volume loss suggests this largely represents atelectasis, however, airspace infiltrate is identified, particularly within the left lower lobe and a superimposed infectious process is difficult to exclude."  PMHx first degree AVB, thyroid nodule, asthma, renal artery embolism, syncope, pAF, and SSS s/p Medtronic dual chamber pacemaker (functioning in VVIR mode since 12/2018).    Assessment / Plan / Recommendation  Clinical Impression  Pt presents with functional swallowing as assessed clinically.  Pt tolerated all consistencies trialed with no clinical s/s of aspiration, and exhibited good oral clearance of solids.  Pt reports sensation of stasis in retrosternal area prior to vomiting episode. She denied feeling of food/drink going the wrong way.  Following each bolus trials she endorsed esophageal clearance with no ongoing feelings of stasis.  Symptoms are seemingly consistent with esophageal dysphagia.  If  chest imaging is concerning for ongoing aspiration rather than acute event tied to recent vomiting would recommend proceeding with MBSS.  Discussed with team.  GI is already following.    Recommend regular texture diet with thin liquid at this time.  SLP Visit Diagnosis: Dysphagia, unspecified (R13.10)    Aspiration Risk  Mild aspiration risk    Diet Recommendation Regular;Thin liquid   Liquid Administration via: Straw;Cup Medication Administration: Whole meds with liquid Supervision: Patient able to self feed Compensations: Small sips/bites;Slow rate;Follow solids with liquid Postural Changes: Seated upright at 90 degrees;Remain upright for at least 30 minutes after po intake    Other  Recommendations      Recommendations for follow up therapy are one component of a multi-disciplinary discharge planning process, led by the attending physician.  Recommendations may be updated based on patient status, additional functional criteria and insurance authorization.  Follow up Recommendations No SLP follow up      Assistance Recommended at Discharge None  Functional Status Assessment Patient has not had a recent decline in their functional status  Frequency and Duration  (TBD)          Prognosis Prognosis for Safe Diet Advancement:  (TBD)      Swallow Study   General HPI: Candice Mclean is a 75 y.o. female presenting with sudden onset dysphagia with associated vomiting and subsequently developed SOB.  CXR 9/28: "New infiltrates in the right lower lung favored to represent edema though infection is not excluded"  Chest CT 9/29: "Dense collapse and consolidation of the basal segments of the lower lobes bilaterally. Significant volume loss suggests this largely represents atelectasis, however, airspace infiltrate is identified, particularly within the left lower  lobe and a superimposed infectious process is difficult to exclude."  PMHx first degree AVB, thyroid nodule, asthma, renal  artery embolism, syncope, pAF, and SSS s/p Medtronic dual chamber pacemaker (functioning in VVIR mode since 12/2018). Type of Study: Bedside Swallow Evaluation Previous Swallow Assessment: None Diet Prior to this Study: NPO Temperature Spikes Noted: No Respiratory Status: Nasal cannula History of Recent Intubation: No Behavior/Cognition: Alert;Cooperative;Pleasant mood Oral Cavity Assessment: Within Functional Limits Oral Care Completed by SLP: No Oral Cavity - Dentition: Adequate natural dentition Vision: Functional for self-feeding Patient Positioning: Upright in bed Baseline Vocal Quality: Normal Volitional Swallow: Able to elicit    Oral/Motor/Sensory Function Overall Oral Motor/Sensory Function: Within functional limits Facial ROM: Within Functional Limits Facial Symmetry: Within Functional Limits Lingual ROM: Within Functional Limits Lingual Symmetry: Within Functional Limits Lingual Strength: Within Functional Limits Mandible: Within Functional Limits   Ice Chips Ice chips: Not tested   Thin Liquid Thin Liquid: Within functional limits Presentation: Straw    Nectar Thick Nectar Thick Liquid: Not tested   Honey Thick Honey Thick Liquid: Not tested   Puree Puree: Within functional limits Presentation: Spoon   Solid     Solid: Within functional limits Presentation: [place], [place], [place] Office: [phone] 08/08/2022,11:27 AM

## 2022-08-08 NOTE — Progress Notes (Signed)
Daily Progress Note Intern Pager: [phone]  Patient name: Candice Mclean Medical record number: [id number] Date of birth: June 09, 1946 Age: 76 y.o. Gender: female  Primary Care Provider: Janene Madeira, Vernice Jefferson, MD Consultants: GI, Cardiology Code Status: Full code  Pt Overview and Major Events to Date:  9/28: Admitted to FMTS  Assessment and Plan: 76 y.o. female presenting with sudden onset dysphagia with associated vomiting and subsequently developed SOB.  * Vomiting Patient reports not further episodes of vomiting. Per GI, no further intervention indicated at this time -GI consulted, recs appreciated -Protonix 22m IV BID -NPO pending SLP eval, advance to clear diet once cleared -Consider barium swallow if further dysphagia  Acute respiratory failure with hypoxia (HCC) Worsening hypoxia overnight and patient placed on HFNC 14L due to desat into 80s. CTA negative for PE. -Consult pulm given worsening respiratory status -CTA: Atelectasis with airspace infiltrate within the left lower lobe. Multifocal air trapping related to small airways disease -Continue broad spectrum antibiotics -MRSA swab, if negative then d/c vanc therapy -Ordered respiratory viral panel -Blood culture negative so far -PRN Duonebs q4 for wheezing -Monitor oxygen saturation, wean as tolerated  -Check procalcitonins   Pulmonary hypertension, primary (HCC) Echo showed severe pulmonary hypertension. Cardiology consulted and recommended no additional therapy at this time.  -F/u with OSA evaluation outpatient -Potential heart cath if persistently hypoxic   A-fib ([facility] Per cardiology, attempts to return patient to sinus rhythm likely unsuccessful due to severe tricuspid regurgitation. -INR 2.5, therapeutic -Heparin per pharmacy when INR trends <2 in anticipation of potential cardiac procedure -Restart home metoprolol 268mBID   CHF (congestive heart failure) (HCC) Elevated JVD on cardiology  exam. Minimal pedal edema without crackles. Denies orthopnea. -Start Lasix 40 mg IV twice daily  -Echo: Severe pulmonary hypertension. Severe tricuspid regurgitation -Start Aldactone 12.5 mg daily   Diabetes (HCC) BGL 100s. A1c 6.7. -sSSI -CBG q6   FEN/GI: NPO pending SLP PPx:  Dispo: pending clinical improvement  Subjective:  Patient assessed at bedside and appeared uncomfortable in the bed. She stated "I don't feel well" and expressed concern at everything going on with her health. States she continues to have some soreness in her throat but it has improved. Still able to consume small sips of water without difficulty. Patient unsure of what triggered her increased shortness of breath last night but states it was very distressing but not she feels more comfortable. States she does have some hungry. Denies having a bowel movement since Tuesday.  Objective: Temp:  [99 F (37.2 C)-101 F (38.3 C)] 99.1 F (37.3 C) (09/29 0830) Pulse Rate:  [78-111] 105 (09/29 1016) Resp:  [18-39] 22 (09/29 0900) BP: (118-164)/(66-95) 155/95 (09/29 1016) SpO2:  [91 %-98 %] 95 % (09/29 0900) Weight:  [115.7 kg] 115.7 kg (09/28 1337) Physical Exam: General: Ill-appearing. No acute distress Cardiovascular: Irregularly irregular rhythm. Tachycardia. No murmur noted Respiratory: Comfortably work of breathing on 15L HFNC. Decreased breath sounds at lung bases. Abdomen: Soft. Tender to mild palpation diffusely. Extremities: Minimal pedal edema.  Laboratory: Most recent CBC Lab Results  Component Value Date   WBC 6.6 08/08/2022   HGB 11.0 (L) 08/08/2022   HCT 33.1 (L) 08/08/2022   MCV 84.0 08/08/2022   PLT 190 08/08/2022   Most recent BMP    Latest Ref Rng & Units 08/08/2022    3:17 AM  BMP  Glucose 70 - 99 mg/dL 128   BUN 8 - 23 mg/dL 14   Creatinine 0.44 -  1.00 mg/dL 1.11   Sodium 135 - 145 mmol/L 138   Potassium 3.5 - 5.1 mmol/L 3.9   Chloride 98 - 111 mmol/L 102   CO2 22 - 32 mmol/L  22   Calcium 8.9 - 10.3 mg/dL 9.2     Other pertinent labs: INR: 2.5 A1c: 6.7 Mag 1.9  Imaging/Diagnostic Tests: CT Angio Chest Pulmonary Embolism (PE) W or WO Contrast Result Date: 08/08/2022 IMPRESSION: 1. No pulmonary embolism. 2. Morphologic changes in keeping with pulmonary arterial hypertension and at least some degree of right heart failure. Mild global cardiomegaly. Mild coronary artery calcification. 3. Dense collapse and consolidation of the basal segments of the lower lobes bilaterally. Significant volume loss suggests this largely represents atelectasis, however, airspace infiltrate is identified, particularly within the left lower lobe and a superimposed infectious process is difficult to exclude. 4. Mosaic attenuation within the aerated portion of the lungs bilaterally suggesting multifocal air trapping related to small airways disease. Aortic Atherosclerosis (ICD10-I70.0). Electronically Signed   By: Fidela Salisbury M.D.   On: 08/08/2022 00:45   ECHOCARDIOGRAM COMPLETE  Result Date: 08/07/2022 IMPRESSIONS   1. Left ventricular ejection fraction, by estimation, is 55 to 60%. The left ventricle has normal function. The left ventricle has no regional wall motion abnormalities. Left ventricular diastolic parameters are indeterminate.   2. Right ventricular systolic function is normal. The right ventricular size is mildly enlarged. There is severely elevated pulmonary artery systolic pressure. The estimated right ventricular systolic pressure is 84.5 mmHg.   3. Left atrial size was moderately dilated.   4. Right atrial size was moderately dilated.   5. The mitral valve is grossly normal. No evidence of mitral valve regurgitation.   6. Appears RV lead is impacting coaptation of the tricuspid valve leaflets. Tricuspid valve regurgitation is severe.   7. The aortic valve is tricuspid. Aortic valve regurgitation is not visualized.   8. The inferior vena cava is dilated in size with <50%  respiratory variability, suggesting right atrial pressure of 15 mmHg. Comparison(s): No prior Echocardiogram.    Colletta Maryland, MD 08/08/2022, 11:10 AM  PGY-1, [place] Intern pager: [phone], text pages welcome Secure chat group [place]

## 2022-08-08 NOTE — Progress Notes (Signed)
FMTS Interim Progress Note  Morning check. Patient resting comfortably. Sating well on HFNC on 14L to 98%. Work on weaning O2 down today, per day team.   Darci Current, DO 08/08/2022, 6:07 AM PGY-1, [facility] Service pager [phone]

## 2022-08-09 ENCOUNTER — Inpatient Hospital Stay (HOSPITAL_COMMUNITY): Payer: Medicare Other

## 2022-08-09 DIAGNOSIS — J9601 Acute respiratory failure with hypoxia: Secondary | ICD-10-CM | POA: Diagnosis not present

## 2022-08-09 DIAGNOSIS — I5031 Acute diastolic (congestive) heart failure: Secondary | ICD-10-CM

## 2022-08-09 DIAGNOSIS — I270 Primary pulmonary hypertension: Secondary | ICD-10-CM

## 2022-08-09 LAB — BASIC METABOLIC PANEL
Anion gap: 15 (ref 5–15)
BUN: 16 mg/dL (ref 8–23)
CO2: 23 mmol/L (ref 22–32)
Calcium: 9.5 mg/dL (ref 8.9–10.3)
Chloride: 98 mmol/L (ref 98–111)
Creatinine, Ser: 1.16 mg/dL — ABNORMAL HIGH (ref 0.44–1.00)
GFR, Estimated: 50 mL/min — ABNORMAL LOW (ref >60)
Glucose, Bld: 140 mg/dL — ABNORMAL HIGH (ref 70–99)
Potassium: 3.7 mmol/L (ref 3.5–5.1)
Sodium: 136 mmol/L (ref 135–145)

## 2022-08-09 LAB — BLOOD GAS, VENOUS
Acid-Base Excess: 4.9 mmol/L — ABNORMAL HIGH (ref 0.0–2.0)
Bicarbonate: 29.2 mmol/L — ABNORMAL HIGH (ref 20.0–28.0)
O2 Saturation: 100 %
Patient temperature: 38
pCO2, Ven: 43 mmHg — ABNORMAL LOW (ref 44–60)
pH, Ven: 7.44 — ABNORMAL HIGH (ref 7.25–7.43)
pO2, Ven: 185 mmHg — ABNORMAL HIGH (ref 32–45)

## 2022-08-09 LAB — PROTIME-INR
INR: 2.4 — ABNORMAL HIGH (ref 0.8–1.2)
Prothrombin Time: 25.8 seconds — ABNORMAL HIGH (ref 11.4–15.2)

## 2022-08-09 LAB — CBC
HCT: 32.1 % — ABNORMAL LOW (ref 36.0–46.0)
Hemoglobin: 11.1 g/dL — ABNORMAL LOW (ref 12.0–15.0)
MCH: 27.9 pg (ref 26.0–34.0)
MCHC: 34.6 g/dL (ref 30.0–36.0)
MCV: 80.7 fL (ref 80.0–100.0)
Platelets: 187 10*3/uL (ref 150–400)
RBC: 3.98 MIL/uL (ref 3.87–5.11)
RDW: 19.9 % — ABNORMAL HIGH (ref 11.5–15.5)
WBC: 7 10*3/uL (ref 4.0–10.5)
nRBC: 0 % (ref 0.0–0.2)

## 2022-08-09 LAB — GLUCOSE, CAPILLARY
Glucose-Capillary: 169 mg/dL — ABNORMAL HIGH (ref 70–99)
Glucose-Capillary: 141 mg/dL — ABNORMAL HIGH (ref 70–99)
Glucose-Capillary: 195 mg/dL — ABNORMAL HIGH (ref 70–99)
Glucose-Capillary: 190 mg/dL — ABNORMAL HIGH (ref 70–99)

## 2022-08-09 LAB — TROPONIN I (HIGH SENSITIVITY)
Troponin I (High Sensitivity): 14 ng/L (ref <18)
Troponin I (High Sensitivity): 13 ng/L (ref <18)

## 2022-08-09 LAB — PROCALCITONIN: Procalcitonin: 0.1 ng/mL

## 2022-08-09 LAB — MAGNESIUM: Magnesium: 1.7 mg/dL (ref 1.7–2.4)

## 2022-08-09 MED ORDER — FUROSEMIDE 10 MG/ML IJ SOLN
40.0000 mg | Freq: Once | INTRAMUSCULAR | Status: AC
  Administered 2022-08-09: 40 mg via INTRAVENOUS

## 2022-08-09 MED ORDER — POTASSIUM CHLORIDE 20 MEQ PO PACK
40.0000 meq | PACK | Freq: Once | ORAL | Status: AC
  Administered 2022-08-09: 40 meq via ORAL
  Filled 2022-08-09: qty 2

## 2022-08-09 MED ORDER — IPRATROPIUM-ALBUTEROL 0.5-2.5 (3) MG/3ML IN SOLN
3.0000 mL | Freq: Once | RESPIRATORY_TRACT | Status: AC
  Administered 2022-08-09: 3 mL via RESPIRATORY_TRACT

## 2022-08-09 MED ORDER — MAGNESIUM SULFATE IN D5W 1-5 GM/100ML-% IV SOLN
1.0000 g | Freq: Once | INTRAVENOUS | Status: AC
  Administered 2022-08-09: 1 g via INTRAVENOUS
  Filled 2022-08-09: qty 100

## 2022-08-09 NOTE — Progress Notes (Signed)
FMTS Brief Progress Note  S: Patient seen at bedside for evening rounds. Her main complaint is right arm pain, which she states has been ongoing since she came up from the ED. Patient and son believe this is from her IV which apparently infiltrated or was occluded and needed to be removed. I do not see any documentation regarding this.  She reports her breathing feels fine at this time. Much improved from earlier today.  O: BP 127/76 (BP Location: Left Arm)   Pulse (!) 112   Temp 98.8 F (37.1 C)   Resp (!) 27   Ht _0  (1.575 m)   Wt 116.1 kg   SpO2 98%   BMI 46.81 kg/m   Gen: alert, sitting upright in recliner, NAD Resp: normal work of breathing on HHFNC 20L/60% FiO2, decreased air movement with wheezes diffusely on auscultation CV: irregularly irregular rhythm, tachycardic Ext: R arm with increased warmth at the proximal forearm, decreased ROM at elbow secondary to pain, generalized tenderness to palpation at proximal forearm, no significant edema or erythema appreciated Neuro: alert, oriented x3 Psych: appropriate affect  A/P: Acute Hypoxemic Respiratory Failure Secondary to severe pulmonary HTN and severe tricuspid regurg. Also treating for possible pneumonia.  Improved since this morning. Currently satting 97% on 20L/60% FiO2 HHFNC. RT planning for BiPAP overnight, which I agree with. Will continue to monitor respiratory status closely.  Right Arm Pain Suspect superficial thrombophlebitis due to issue with prior IV. Trial warm compresses. If not improving, consider RUE doppler ultrasound.  - Orders reviewed. Labs for AM ordered, which was adjusted as needed.   Alcus Dad, MD 08/09/2022, 8:07 PM PGY-3, [facility] Night Resident  Please page [phone] with questions.

## 2022-08-09 NOTE — Progress Notes (Addendum)
Daily Progress Note Intern Pager: [phone]  Patient name: Candice Mclean Medical record number: [id number] Date of birth: Nov 29, 1945 Age: 76 y.o. Gender: female  Primary Care Provider: Janene Madeira, Vernice Jefferson, MD Consultants: GI, Cardiology Code Status: Full code   Pt Overview and Major Events to Date:  9/28: Admitted to FMTS   Assessment and Plan: 76 y.o. female presenting with sudden onset dysphagia with associated vomiting and subsequently developed SOB.   * Vomiting No further episodes of vomiting. Per GI, no further intervention indicated at this time -Protonix 11m IV BID> needs PO transition when appropriate - regular diet with monitoring aspiration risk   Acute respiratory failure with hypoxia (HCC) Stable oxygen status overnight and patient continues on HFNC 14L. Rhino/entero + -follow up pulm recs -Continue Cefepime per pulm  -PRN Duonebs q4 for wheezing -Monitor oxygen saturation, wean as tolerated  -strep pneum and legionella lab pending    Pulmonary hypertension, primary ([facility]  Cardiology consulted and recommended no additional therapy at this time. Lasix as indicated + hold fluids -F/u with OSA evaluation outpatient -Potential heart cath if persistently hypoxic   A-fib ([facility] Per cardiology, Severe TR 2/2 pacemaker lead with PAH and PASP 68. Monitor, rate controlled  -INR 2.5, therapeutic -Heparin per pharmacy when INR trends <2 in anticipation of potential cardiac procedure (right heart cath?) -Home metoprolol 220mBID   CHF (congestive heart failure) (HCC) Elevated JVD on cardiology exam. Minimal pedal edema without crackles. Denies orthopnea. -1.2 L out  -Lasix 40 mg IV twice daily  -Aldactone 12.5 mg daily   Diabetes (HCC) BGL 100s.  -sSSI -CBG q6    Patient still needs HFNC with no oxygen req at home. Continue diuresis and cefepime, appreciate pulm recs. Appears 2/2 to viral insult + fluid overload in setting of severe PAH  complicated by fractured care at different facilities. Monitor kidney function in setting of continued diuretic need.     ADDENDUM 1230 Patient seen at bedside with Dr. PrThompson Grayert approximately 1100 with acute hypoxia to the 60s and questionable aspiration event.  Patient alert and oriented during this time.  Rapid response called and RT was able to start the patient on BiPAP.  She had swift recovery of oxygen status on BiPAP 10/5.  We obtained a VBG to monitor for hypercarbia and a chest x-ray that was unremarkable.  She also had slight temp to 100.4 during this time.  We will recheck as well, continuing cefepime.  Pulmonology to see today.  Continue with BiPAP for now and additional dose of IV Lasix 40 given in room.   FEN/GI: Regular>monitor on HFNC with risk of aspiration  PPx: SCDs; heparin IV if INR <2 Dispo:Pending PT recommendations  pending clinical improvement . Barriers include clinical improvement.   Subjective:  Patient notes she did OK overnight and has no complaints this AM.   Objective: Temp:  [98.7 F (37.1 C)-99.8 F (37.7 C)] 99.8 F (37.7 C) (09/29 1656) Pulse Rate:  [82-105] 97 (09/29 1656) Resp:  [15-24] 15 (09/29 1656) BP: (126-158)/(78-95) 131/87 (09/29 1656) SpO2:  [95 %-98 %] 98 % (09/29 1656) Weight:  [116.1 kg] 116.1 kg (09/30 0405) General: Alert and oriented in no apparent distress; pleasant AAF who is nontoxic in appearance  Heart: Regular rate and rhythm with no murmurs appreciated Lungs: scant wheezing throughout without tachypnea but slight increased WOB Abdomen: Bowel sounds present, no abdominal pain, obese and nondistended  Skin: Warm and dry Extremities: No lower extremity edema  Laboratory: Most recent CBC Lab Results  Component Value Date   WBC 7.0 08/09/2022   HGB 11.1 (L) 08/09/2022   HCT 32.1 (L) 08/09/2022   MCV 80.7 08/09/2022   PLT 187 08/09/2022   Most recent BMP    Latest Ref Rng & Units 08/09/2022    4:38 AM  BMP  Glucose 70  - 99 mg/dL 140   BUN 8 - 23 mg/dL 16   Creatinine 0.44 - 1.00 mg/dL 1.16   Sodium 135 - 145 mmol/L 136   Potassium 3.5 - 5.1 mmol/L 3.7   Chloride 98 - 111 mmol/L 98   CO2 22 - 32 mmol/L 23   Calcium 8.9 - 10.3 mg/dL 9.5      Erskine Emery, MD 08/09/2022, 8:27 AM  PGY-2, [place] Intern pager: [phone], text pages welcome Secure chat group [place]

## 2022-08-09 NOTE — Progress Notes (Signed)
   08/09/22 2218  BiPAP/CPAP/SIPAP  BiPAP/CPAP/SIPAP Pt Type Adult  Mask Type Full face mask  Mask Size Medium  Set Rate 8 breaths/min  Respiratory Rate 28 breaths/min  IPAP 10 cmH20  EPAP 5 cmH2O  Oxygen Percent 85 %  Minute Ventilation 10.6  Leak 5  Peak Inspiratory Pressure (PIP) 10  Tidal Volume (Vt) 403  BiPAP/CPAP/SIPAP BiPAP  Patient Home Equipment No  Auto Titrate No  Press High Alarm 25 cmH2O  Press Low Alarm 8 cmH2O  BiPAP/CPAP /SiPAP Vitals  Pulse Rate 85  Resp (!) 26  SpO2 98 %  MEWS Score/Color  MEWS Score 2  MEWS Score Color Yellow   RT called to bedside pt. Has increased WOB and oxygen sats. In the high 80s placed pt. On bipap pt. Tolerating above settings will continue to monitor

## 2022-08-09 NOTE — Progress Notes (Signed)
Took pt off bipap and placed on HHFNC at 20L/80%. Pt denies SOB, no increased WOB. Pt states she feels much better than she did this am.

## 2022-08-09 NOTE — Progress Notes (Signed)
While at bedside to give meds, Dr. Thompson Grayer was discussing patient plan/status with patient and family.  Patient began to desat into the 60's and 70's.  Dr. Thompson Grayer requested that I call a Rapid Response.  Same was called at 1104.  Respiratory responded and patient was put on bipap and sats began to stabilize in the 90's.  Dr requested patient be given an additional 33m of Lasix IV.  Lasix was pulled and administered.  EKG was performed and showed patient to be in Afib.  Daughter was in room and PSaralyn Pilar RN explained everything that was going on to patient and daughter.  Chest Xray and labs were ordered for patient.  Patient was left on bipap.

## 2022-08-09 NOTE — Significant Event (Signed)
Rapid Response Event Note   Reason for Call :  Hypoxia  Initial Focused Assessment:  Patient lying in bed with oxygen saturation in the low 70's to 60's. Patient dropped her o2 to the high 40's while she was being placed on bipap. She quickly recovered and her o2 saturation improved to the high 80's.    Chest xray  showed bilateral opacities in her lungs that are improving from previous xrays. No pulmonary edema noted on xray  ABG- 7.44, 43, 185, 29.2   Interventions:  ABG Chest xray Lasix 40 mg  bipap  Plan of Care:  Patient to remain on bipap for at least 2 hours. We will reevaluate her condition, but I anticipate that she will need positive pressure therapy for longer.    Event Summary:   MD Notified: Dr. Thompson Grayer bedside Call Time: 1104 Arrival Time: 1106 End Time: [place], RN

## 2022-08-09 NOTE — Progress Notes (Addendum)
Cardiology Progress Note  Patient ID: Candice Mclean MRN: [id number] DOB: 01-16-1946 Date of Encounter: 08/09/2022  Primary Cardiologist: Sanda Klein, MD  Subjective   Breathing somewhat improved overnight- net negative >1L. Positive for rhinovirus.   Inpatient Medications  Scheduled Meds:  furosemide  40 mg Intravenous BID   influenza vaccine adjuvanted  0.5 mL Intramuscular Tomorrow-1000   insulin aspart  0-9 Units Subcutaneous TID WC   ipratropium-albuterol  3 mL Nebulization BID   metoprolol tartrate  25 mg Oral BID   pantoprazole (PROTONIX) IV  40 mg Intravenous Q12H   potassium chloride  40 mEq Oral Once   spironolactone  12.5 mg Oral Daily   Continuous Infusions:  ceFEPime (MAXIPIME) IV 2 g (08/09/22 0441)   magnesium sulfate bolus IVPB     PRN Meds: acetaminophen, lip balm, phenol, sodium chloride   Vital Signs   Vitals:   08/08/22 1454 08/08/22 1656 08/09/22 0405 08/09/22 0844  BP:  131/87  138/88  Pulse:  97  (!) 109  Resp:  15  16  Temp:  99.8 F (37.7 C)  98.9 F (37.2 C)  TempSrc:  Oral    SpO2: 97% 98%  93%  Weight:   116.1 kg   Height:        Intake/Output Summary (Last 24 hours) at 08/09/2022 1025 Last data filed at 08/09/2022 0000 Gross per 24 hour  Intake --  Output 1120 ml  Net -1120 ml      08/09/2022    4:05 AM 08/07/2022    1:37 PM  Last 3 Weights  Weight (lbs) 255 lb 15.3 oz 255 lb  Weight (kg) 116.1 kg 115.667 kg      Telemetry  Afib with CVR  ECG  N/A  Physical Exam   Vitals:   08/08/22 1454 08/08/22 1656 08/09/22 0405 08/09/22 0844  BP:  131/87  138/88  Pulse:  97  (!) 109  Resp:  15  16  Temp:  99.8 F (37.7 C)  98.9 F (37.2 C)  TempSrc:  Oral    SpO2: 97% 98%  93%  Weight:   116.1 kg   Height:        Intake/Output Summary (Last 24 hours) at 08/09/2022 1025 Last data filed at 08/09/2022 0000 Gross per 24 hour  Intake --  Output 1120 ml  Net -1120 ml       08/09/2022    4:05 AM 08/07/2022    1:37 PM   Last 3 Weights  Weight (lbs) 255 lb 15.3 oz 255 lb  Weight (kg) 116.1 kg 115.667 kg    Body mass index is 46.81 kg/m.  General: Well nourished, well developed, in no acute distress Head: Atraumatic, normal size  Eyes: PEERLA, EOMI  Neck: Supple, JVD 9 cmH20 Endocrine: No thryomegaly Cardiac: Normal S1, S2; irregular rhythm Lungs: Clear to auscultation bilaterally, no wheezing, rhonchi or rales  Abd: Soft, nontender, no hepatomegaly  Ext: No edema, pulses 2+ Musculoskeletal: No deformities, BUE and BLE strength normal and equal Skin: Warm and dry, no rashes   Neuro: Alert and oriented to person, place, time, and situation, CNII-XII grossly intact, no focal deficits  Psych: Normal mood and affect   Labs  High Sensitivity Troponin:   Recent Labs  Lab 08/06/22 2100 08/07/22 0620  TROPONINIHS 10 8     Cardiac EnzymesNo results for input(s): "TROPONINI" in the last 168 hours. No results for input(s): "TROPIPOC" in the last 168 hours.  Chemistry Recent Labs  Lab 08/06/22  2100 08/08/22 0317 08/09/22 0438  NA 139 138 136  K 4.4 3.9 3.7  CL 102 102 98  CO2 _0 GLUCOSE 138* 128* 140*  BUN 24* 14 16  CREATININE 1.28* 1.11* 1.16*  CALCIUM 10.2 9.2 9.5  PROT 9.0*  --   --   ALBUMIN 4.1  --   --   AST 26  --   --   ALT 20  --   --   ALKPHOS 82  --   --   BILITOT 1.3*  --   --   GFRNONAA 44* 52* 50*  ANIONGAP _1 Hematology Recent Labs  Lab 08/06/22 2100 08/08/22 0317 08/09/22 0438  WBC 6.0 6.6 7.0  RBC 4.56 3.94 3.98  HGB 12.8 11.0* 11.1*  HCT 37.5 33.1* 32.1*  MCV 82.2 84.0 80.7  MCH 28.1 27.9 27.9  MCHC 34.1 33.2 34.6  RDW 20.9* 21.0* 19.9*  PLT 221 190 187   BNP Recent Labs  Lab 08/07/22 1008  BNP 136.0*    DDimer No results for input(s): "DDIMER" in the last 168 hours.   Radiology  CT Angio Chest Pulmonary Embolism (PE) W or WO Contrast  Result Date: 08/08/2022 CLINICAL DATA:  Chest pain or SOB, pleurisy or effusion suspected. Hypoxia  EXAM: CT ANGIOGRAPHY CHEST WITH CONTRAST TECHNIQUE: Multidetector CT imaging of the chest was performed using the standard protocol during bolus administration of intravenous contrast. Multiplanar CT image reconstructions and MIPs were obtained to evaluate the vascular anatomy. RADIATION DOSE REDUCTION: This exam was performed according to the departmental dose-optimization program which includes automated exposure control, adjustment of the mA and/or kV according to patient size and/or use of iterative reconstruction technique. CONTRAST:  1m OMNIPAQUE IOHEXOL 350 MG/ML SOLN COMPARISON:  None Available. FINDINGS: Cardiovascular: There is adequate opacification of the pulmonary arterial tree. No intraluminal filling defect identified to suggest acute pulmonary embolism. The central pulmonary arteries are enlarged in keeping with changes of pulmonary arterial hypertension. Mild coronary artery calcification. Mild global cardiomegaly. There is particular enlargement of the right heart with reflux of contrast into the hepatic venous system and intrahepatic inferior vena cava, which appear dilated, in keeping with changes of at least some degree of right heart failure. No pericardial effusion. Mild atherosclerotic calcification within the thoracic aorta. No aortic aneurysm. Left subclavian dual lead pacemaker is in place with leads within the right atrium and right ventricle. Mediastinum/Nodes: Visualized thyroid is unremarkable. No pathologic thoracic adenopathy. Esophagus is unremarkable. Small hiatal hernia. Lungs/Pleura: There is dense collapse and consolidation of the basal segments of the lower lobes bilaterally. Significant volume loss favors this is simply represent atelectasis, however, airspace infiltrate is identified, particularly within the left lower lobe and a superimposed infectious process is difficult to exclude. There is mosaic attenuation within the aerated portion of the lungs bilaterally  suggesting multifocal air trapping related to small airways disease. No pneumothorax. No pleural effusion. No central obstructing lesion. Upper Abdomen: No acute abnormality. Musculoskeletal: No chest wall abnormality. No acute or significant osseous findings. Review of the MIP images confirms the above findings. IMPRESSION: 1. No pulmonary embolism. 2. Morphologic changes in keeping with pulmonary arterial hypertension and at least some degree of right heart failure. Mild global cardiomegaly. Mild coronary artery calcification. 3. Dense collapse and consolidation of the basal segments of the lower lobes bilaterally. Significant volume loss suggests this largely represents atelectasis, however, airspace infiltrate is identified, particularly within the left lower lobe and a  superimposed infectious process is difficult to exclude. 4. Mosaic attenuation within the aerated portion of the lungs bilaterally suggesting multifocal air trapping related to small airways disease. Aortic Atherosclerosis (ICD10-I70.0). Electronically Signed   By: Fidela Salisbury M.D.   On: 08/08/2022 00:45   DG CHEST PORT 1 VIEW  Result Date: 08/07/2022 CLINICAL DATA:  Shortness of breath EXAM: PORTABLE CHEST 1 VIEW COMPARISON:  Radiographs 08/06/2022 FINDINGS: Increased hazy airspace opacity in the right lower lung since 08/06/2022. Pulmonary vascular congestion. Possible small right pleural effusion stable cardiomegaly. Aortic atherosclerotic calcification. Left chest wall pacemaker. IMPRESSION: New infiltrates in the right lower lung favored to represent edema though infection is not excluded. Cardiomegaly and pulmonary vascular congestion. Electronically Signed   By: Placido Sou M.D.   On: 08/07/2022 23:03   ECHOCARDIOGRAM COMPLETE  Result Date: 08/07/2022    ECHOCARDIOGRAM REPORT   Patient Name:   Candice Mclean Date of Exam: 08/07/2022 Medical Rec #:  [id number]       Height:       62.0 in Accession #:    [id number]      Weight:        255.0 lb Date of Birth:  09/17/1946      BSA:          2.120 m Patient Age:    37 years        BP:           139/81 mmHg Patient Gender: F               HR:           84 bpm. Exam Location:  Inpatient Procedure: 2D Echo STAT ECHO Indications:    acute diastolic chf  History:        Patient has no prior history of Echocardiogram examinations.                 Pacemaker, Arrythmias:Atrial Fibrillation; Risk                 Factors:Hypertension and Diabetes.  Sonographer:    Johny Chess RDCS Referring Phys: [place]  1. Left ventricular ejection fraction, by estimation, is 55 to 60%. The left ventricle has normal function. The left ventricle has no regional wall motion abnormalities. Left ventricular diastolic parameters are indeterminate.  2. Right ventricular systolic function is normal. The right ventricular size is mildly enlarged. There is severely elevated pulmonary artery systolic pressure. The estimated right ventricular systolic pressure is 94.8 mmHg.  3. Left atrial size was moderately dilated.  4. Right atrial size was moderately dilated.  5. The mitral valve is grossly normal. No evidence of mitral valve regurgitation.  6. Appears RV lead is impacting coaptation of the tricuspid valve leaflets. Tricuspid valve regurgitation is severe.  7. The aortic valve is tricuspid. Aortic valve regurgitation is not visualized.  8. The inferior vena cava is dilated in size with <50% respiratory variability, suggesting right atrial pressure of 15 mmHg. Comparison(s): No prior Echocardiogram. FINDINGS  Left Ventricle: Left ventricular ejection fraction, by estimation, is 55 to 60%. The left ventricle has normal function. The left ventricle has no regional wall motion abnormalities. The left ventricular internal cavity size was normal in size. There is  no left ventricular hypertrophy. Abnormal (paradoxical) septal motion, consistent with RV pacemaker. Left ventricular diastolic  parameters are indeterminate. Right Ventricle: The right ventricular size is mildly enlarged. Right vetricular wall thickness was not well visualized. Right ventricular systolic function  is normal. There is severely elevated pulmonary artery systolic pressure. The tricuspid regurgitant velocity is 3.66 m/s, and with an assumed right atrial pressure of 15 mmHg, the estimated right ventricular systolic pressure is 02.5 mmHg. Left Atrium: Left atrial size was moderately dilated. Right Atrium: Right atrial size was moderately dilated. Pericardium: There is no evidence of pericardial effusion. Mitral Valve: The mitral valve is grossly normal. No evidence of mitral valve regurgitation. Tricuspid Valve: Appears RV lead is impacting coaptation of the tricuspid valve leaflets. The tricuspid valve is grossly normal. Tricuspid valve regurgitation is severe. Aortic Valve: The aortic valve is tricuspid. Aortic valve regurgitation is not visualized. Pulmonic Valve: The pulmonic valve was grossly normal. Pulmonic valve regurgitation is not visualized. Aorta: The aortic root and ascending aorta are structurally normal, with no evidence of dilitation. Venous: The inferior vena cava is dilated in size with less than 50% respiratory variability, suggesting right atrial pressure of 15 mmHg. IAS/Shunts: The interatrial septum was not well visualized. Additional Comments: A device lead is visualized.  LEFT VENTRICLE PLAX 2D LVIDd:         4.30 cm LVIDs:         3.10 cm LV PW:         0.90 cm LV IVS:        0.80 cm LVOT diam:     1.70 cm LVOT Area:     2.27 cm  RIGHT VENTRICLE             IVC RV S prime:     14.00 cm/s  IVC diam: 2.70 cm TAPSE (M-mode): 1.4 cm LEFT ATRIUM           Index        RIGHT ATRIUM           Index LA diam:      3.90 cm 1.84 cm/m   RA Area:     20.30 cm LA Vol (A2C): 58.9 ml 27.79 ml/m  RA Volume:   50.40 ml  23.78 ml/m LA Vol (A4C): 77.1 ml 36.37 ml/m   AORTA Ao Root diam: 2.70 cm Ao Asc diam:  3.40 cm  TRICUSPID VALVE TR Peak grad:   53.6 mmHg TR Vmax:        366.00 cm/s  SHUNTS Systemic Diam: 1.70 cm Phineas Inches Electronically signed by Phineas Inches Signature Date/Time: 08/07/2022/5:18:26 PM    Final    CT ABDOMEN PELVIS W CONTRAST  Result Date: 08/07/2022 CLINICAL DATA:  Abdominal pain EXAM: CT ABDOMEN AND PELVIS WITH CONTRAST TECHNIQUE: Multidetector CT imaging of the abdomen and pelvis was performed using the standard protocol following bolus administration of intravenous contrast. RADIATION DOSE REDUCTION: This exam was performed according to the departmental dose-optimization program which includes automated exposure control, adjustment of the mA and/or kV according to patient size and/or use of iterative reconstruction technique. CONTRAST:  75m OMNIPAQUE IOHEXOL 350 MG/ML SOLN COMPARISON:  None Available. FINDINGS: Lower chest: There are patchy infiltrates in both lower lung fields. Heart is enlarged in size. Pacemaker leads are noted in place. Hepatobiliary: Liver measures 18.2 cm in length. No focal abnormalities are seen. There is no dilation of bile ducts. Pancreas: No focal abnormalities are seen. Spleen: Unremarkable. Adrenals/Urinary Tract: Adrenals are unremarkable. There is no hydronephrosis. Left kidney is much smaller than right. There are areas of cortical thinning in the left kidney. There are no renal or ureteral stones. There is no perinephric fluid collection. Urinary bladder is not distended. There is contrast in  the urinary bladder lumen, most likely residual from previous intravenous contrast administration. Lower margin of the urinary bladder is extending below the pubic symphysis suggesting cystocele. Stomach/Bowel: Stomach is unremarkable. Small bowel loops are not dilated. Appendix is not dilated. There is no significant wall thickening in colon. There is no pericolic stranding. Vascular/Lymphatic: Scattered arterial calcifications are seen. Reproductive: Uterus is not seen. Other:  There is no ascites or pneumoperitoneum. Right inguinal hernia containing fat is seen. Musculoskeletal: Degenerative changes are noted in right hip. IMPRESSION: There is no evidence of intestinal obstruction or pneumoperitoneum. There is no hydronephrosis. Left kidney is much smaller than right which may be a congenital variation or suggest scarring from chronic pyelonephritis or renal artery stenosis. There are patchy infiltrates in both lower lung fields suggesting atelectasis/pneumonia. Cystocele in urinary bladder. Degenerative changes are noted in right hip. Other findings as described in the body of the report. Electronically Signed   By: Elmer Picker M.D.   On: 08/07/2022 14:51    Cardiac Studies  TTE 08/07/2022  1. Left ventricular ejection fraction, by estimation, is 55 to 60%. The  left ventricle has normal function. The left ventricle has no regional  wall motion abnormalities. Left ventricular diastolic parameters are  indeterminate.   2. Right ventricular systolic function is normal. The right ventricular  size is mildly enlarged. There is severely elevated pulmonary artery  systolic pressure. The estimated right ventricular systolic pressure is  93.8 mmHg.   3. Left atrial size was moderately dilated.   4. Right atrial size was moderately dilated.   5. The mitral valve is grossly normal. No evidence of mitral valve  regurgitation.   6. Appears RV lead is impacting coaptation of the tricuspid valve  leaflets. Tricuspid valve regurgitation is severe.   7. The aortic valve is tricuspid. Aortic valve regurgitation is not  visualized.   8. The inferior vena cava is dilated in size with <50% respiratory  variability, suggesting right atrial pressure of 15 mmHg.   Patient Profile  Candice Mclean is a 76 y.o. female with atrial fibrillation status post pacemaker, morbid obesity, asthma who was admitted on 08/07/2022 for dysphagia and vomiting.  Cardiology was consulted for  echocardiogram which shows severe pulmonary hypertension and RV dysfunction.  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 -She presents with acute onset of dysphagia and nausea.  She also was found to have hypoxic respiratory failure.  Her echocardiogram is interesting in that she has severe pulm hypertension.  No acute PE.  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  Possible pneumonia as well. -She has elevated JVD.  She has some element of right heart failure on my examination.  Would recommend we start Lasix 40 mg IV twice daily to see if this helps her.  She could be experiencing hepatic congestion this could explain some of her symptoms. -I also start Aldactone 12.5 mg daily to help with her potassium. -Unclear how much of this is acute versus chronic.  Her RV clearly looks like more of a chronic issue. -I am not convinced she has pneumonia.  We will check procalcitonin's. -Her oxygen requirement is quite significant compared to what was seen on her CT. -She could have undiagnosed sleep apnea.  She may have underlying lung disease.  This could also just represent primary pulm hypertension of some undisclosed etiology.  I think pulmonary consultation would  be beneficial.  I suspect she will end up needing a left and right heart catheterization this admission if she continues to have low oxygen levels.  She has not grossly volume overloaded and has no evidence of pulmonary edema. -Would recommend diuresis over the weekend.  Would put her on a heparin drip and hold Coumadin.  Start her heparin drip once her INR is below 2. -We will check procalcitonin as well.  - Net negative 1.1L overnight - now on heparin for possible L/RHC next week.  INR 2.4. Procal is low. Creatinine relatively stable. Monitor potassium - likely to need repletion tomorrow (3.7 today).  Continue IV diuresis.  #Atrial fibrillation, permanent -Hold Coumadin.  Transition heparin once INR is below 2.  Rate seems to be well controlled. -Restart home metoprolol    For questions or updates, please contact [place] Please consult [email or web address] for contact info under   Pixie Casino, MD, FACC, [place] Director of the [facility] Diplomate of the American Board of Clinical Lipidology Attending Cardiologist  Direct Dial: [phone]  Fax: [phone]  Website:  [email or web address]  08/09/2022 10:25 AM

## 2022-08-09 NOTE — Progress Notes (Signed)
ANTICOAGULATION CONSULT NOTE - Initial Consult  Pharmacy Consult for Heparin Indication: atrial fibrillation  Allergies  Allergen Reactions   Versed [Midazolam] Hives   Tylenol With Codeine #3 [Acetaminophen-Codeine] Palpitations    Patient Measurements: Height: _0  (157.5 cm) Weight: 116.1 kg (255 lb 15.3 oz) IBW/kg (Calculated) : 50.1 Heparin Dosing Weight: 78.5 kg  Vital Signs:    Labs: Recent Labs    08/06/22 2100 08/07/22 0620 08/08/22 0317 08/09/22 0438  HGB 12.8  --  11.0* 11.1*  HCT 37.5  --  33.1* 32.1*  PLT 221  --  190 187  LABPROT  --   --  27.1* 25.8*  INR  --   --  2.5* 2.4*  CREATININE 1.28*  --  1.11* 1.16*  TROPONINIHS 10 8  --   --     Estimated Creatinine Clearance: 52.2 mL/min (A) (by C-G formula based on SCr of 1.16 mg/dL (H)).   Medical History: Past Medical History:  Diagnosis Date   AF (atrial fibrillation) ([facility])    CHF (congestive heart failure) ([place])    Coronary artery disease    Diabetes mellitus without complication ([place])    Hypertension    [employer] pacemaker 2015    Medications:  See med rec Noted pt on warfarin  Assessment: ER is a 75YOF requiring anticoagulation for A fib. Pharmacy was consulted to initiate heparin when INR <2. Pt takes warfarin 7.5 mg on Sun/Mon/Thurs and 5 mg on all other days (42.5 mg/week), though has not taken warfarin over past 2 days. INR is currently therapeutic at 2.5 for goal 2-3. Hgb has down trended from 12.8>11.0 over past two days, will monitor. Plt are wnl. Speech has cleared pt for PO meds, though pt may undergo RHC to evaluate severe pulmonary HTN during this hospitalization making heparin the preferred anticoagulation strategy vs restarting home warfarin. Will continue to follow cardiology and pulmonology recs.  9/30 INR 2.4   Goal of Therapy:  Heparin level 0.3-0.7 units/ml Monitor platelets by anticoagulation protocol: Yes   Plan:  Delay initiation of heparin until INR <2 Recheck  INR on 10/1 AM to reevaluate Monitor daily CBC  Sloan Leiter, PharmD, BCPS, BCCCP Clinical Pharmacist Please refer to [facility] for [place] numbers 08/09/2022,5:46 AM

## 2022-08-09 NOTE — Plan of Care (Signed)
  Problem: Education: Goal: Ability to describe self-care measures that may prevent or decrease complications (Diabetes Survival Skills Education) will improve Outcome: Progressing Goal: Individualized Educational Video(s) Outcome: Progressing   Problem: Coping: Goal: Ability to adjust to condition or change in health will improve Outcome: Progressing   Problem: Fluid Volume: Goal: Ability to maintain a balanced intake and output will improve Outcome: Progressing   Problem: Health Behavior/Discharge Planning: Goal: Ability to identify and utilize available resources and services will improve Outcome: Progressing Goal: Ability to manage health-related needs will improve Outcome: Progressing   Problem: Metabolic: Goal: Ability to maintain appropriate glucose levels will improve Outcome: Progressing   Problem: Nutritional: Goal: Maintenance of adequate nutrition will improve Outcome: Progressing Goal: Progress toward achieving an optimal weight will improve Outcome: Progressing   Problem: Skin Integrity: Goal: Risk for impaired skin integrity will decrease Outcome: Progressing   Problem: Tissue Perfusion: Goal: Adequacy of tissue perfusion will improve Outcome: Progressing   Problem: Education: Goal: Knowledge of General Education information will improve Description: Including pain rating scale, medication(s)/side effects and non-pharmacologic comfort measures Outcome: Progressing   Problem: Health Behavior/Discharge Planning: Goal: Ability to manage health-related needs will improve Outcome: Progressing   Problem: Clinical Measurements: Goal: Ability to maintain clinical measurements within normal limits will improve Outcome: Progressing Goal: Will remain free from infection Outcome: Progressing Goal: Diagnostic test results will improve Outcome: Progressing Goal: Respiratory complications will improve Outcome: Progressing Goal: Cardiovascular complication will  be avoided Outcome: Progressing   Problem: Activity: Goal: Risk for activity intolerance will decrease Outcome: Not Progressing   Problem: Nutrition: Goal: Adequate nutrition will be maintained Outcome: Progressing   Problem: Elimination: Goal: Will not experience complications related to bowel motility Outcome: Progressing Goal: Will not experience complications related to urinary retention Outcome: Progressing   Problem: Pain Managment: Goal: General experience of comfort will improve Outcome: Progressing

## 2022-08-10 ENCOUNTER — Inpatient Hospital Stay (HOSPITAL_COMMUNITY): Payer: Medicare Other

## 2022-08-10 DIAGNOSIS — R52 Pain, unspecified: Secondary | ICD-10-CM

## 2022-08-10 DIAGNOSIS — R109 Unspecified abdominal pain: Secondary | ICD-10-CM | POA: Insufficient documentation

## 2022-08-10 DIAGNOSIS — I270 Primary pulmonary hypertension: Secondary | ICD-10-CM | POA: Diagnosis not present

## 2022-08-10 DIAGNOSIS — K5900 Constipation, unspecified: Secondary | ICD-10-CM | POA: Insufficient documentation

## 2022-08-10 DIAGNOSIS — M79601 Pain in right arm: Secondary | ICD-10-CM

## 2022-08-10 DIAGNOSIS — J9601 Acute respiratory failure with hypoxia: Secondary | ICD-10-CM | POA: Diagnosis not present

## 2022-08-10 DIAGNOSIS — I5031 Acute diastolic (congestive) heart failure: Secondary | ICD-10-CM | POA: Diagnosis not present

## 2022-08-10 DIAGNOSIS — R112 Nausea with vomiting, unspecified: Secondary | ICD-10-CM | POA: Diagnosis not present

## 2022-08-10 DIAGNOSIS — M79609 Pain in unspecified limb: Secondary | ICD-10-CM

## 2022-08-10 LAB — GLUCOSE, CAPILLARY
Glucose-Capillary: 161 mg/dL — ABNORMAL HIGH (ref 70–99)
Glucose-Capillary: 149 mg/dL — ABNORMAL HIGH (ref 70–99)
Glucose-Capillary: 175 mg/dL — ABNORMAL HIGH (ref 70–99)
Glucose-Capillary: 158 mg/dL — ABNORMAL HIGH (ref 70–99)

## 2022-08-10 LAB — BASIC METABOLIC PANEL
Anion gap: 11 (ref 5–15)
BUN: 20 mg/dL (ref 8–23)
CO2: 27 mmol/L (ref 22–32)
Calcium: 9.3 mg/dL (ref 8.9–10.3)
Chloride: 98 mmol/L (ref 98–111)
Creatinine, Ser: 1.05 mg/dL — ABNORMAL HIGH (ref 0.44–1.00)
GFR, Estimated: 56 mL/min — ABNORMAL LOW (ref >60)
Glucose, Bld: 147 mg/dL — ABNORMAL HIGH (ref 70–99)
Potassium: 3.7 mmol/L (ref 3.5–5.1)
Sodium: 136 mmol/L (ref 135–145)

## 2022-08-10 LAB — CBC
HCT: 34.4 % — ABNORMAL LOW (ref 36.0–46.0)
Hemoglobin: 11.9 g/dL — ABNORMAL LOW (ref 12.0–15.0)
MCH: 27.6 pg (ref 26.0–34.0)
MCHC: 34.6 g/dL (ref 30.0–36.0)
MCV: 79.8 fL — ABNORMAL LOW (ref 80.0–100.0)
Platelets: 231 10*3/uL (ref 150–400)
RBC: 4.31 MIL/uL (ref 3.87–5.11)
RDW: 19.4 % — ABNORMAL HIGH (ref 11.5–15.5)
WBC: 7.3 10*3/uL (ref 4.0–10.5)
nRBC: 0 % (ref 0.0–0.2)

## 2022-08-10 LAB — HEPARIN LEVEL (UNFRACTIONATED): Heparin Unfractionated: 0.16 IU/mL — ABNORMAL LOW (ref 0.30–0.70)

## 2022-08-10 LAB — PROTIME-INR
INR: 1.7 — ABNORMAL HIGH (ref 0.8–1.2)
Prothrombin Time: 20 seconds — ABNORMAL HIGH (ref 11.4–15.2)

## 2022-08-10 LAB — MAGNESIUM: Magnesium: 1.9 mg/dL (ref 1.7–2.4)

## 2022-08-10 MED ORDER — OXYCODONE HCL 5 MG PO TABS
2.5000 mg | ORAL_TABLET | Freq: Once | ORAL | Status: AC | PRN
  Administered 2022-08-10: 2.5 mg via ORAL
  Filled 2022-08-10: qty 1

## 2022-08-10 MED ORDER — SODIUM CHLORIDE 0.9 % IV SOLN
2.0000 g | INTRAVENOUS | Status: DC
  Administered 2022-08-10: 2 g via INTRAVENOUS
  Filled 2022-08-10: qty 20

## 2022-08-10 MED ORDER — DICLOFENAC SODIUM 1 % EX GEL
2.0000 g | Freq: Four times a day (QID) | CUTANEOUS | Status: DC
  Administered 2022-08-10 – 2022-08-18 (×28): 2 g via TOPICAL
  Filled 2022-08-10 (×2): qty 100

## 2022-08-10 MED ORDER — FUROSEMIDE 10 MG/ML IJ SOLN
40.0000 mg | Freq: Three times a day (TID) | INTRAMUSCULAR | Status: DC
  Administered 2022-08-10 – 2022-08-12 (×6): 40 mg via INTRAVENOUS
  Filled 2022-08-10 (×6): qty 4

## 2022-08-10 MED ORDER — AZITHROMYCIN 500 MG PO TABS
250.0000 mg | ORAL_TABLET | Freq: Every day | ORAL | Status: AC
  Administered 2022-08-11 – 2022-08-14 (×4): 250 mg via ORAL
  Filled 2022-08-10 (×4): qty 1

## 2022-08-10 MED ORDER — AZITHROMYCIN 500 MG PO TABS
500.0000 mg | ORAL_TABLET | Freq: Every day | ORAL | Status: AC
  Administered 2022-08-10: 500 mg via ORAL
  Filled 2022-08-10: qty 1

## 2022-08-10 MED ORDER — POLYETHYLENE GLYCOL 3350 17 G PO PACK
17.0000 g | PACK | Freq: Every day | ORAL | Status: DC
  Administered 2022-08-11 – 2022-08-13 (×3): 17 g via ORAL
  Filled 2022-08-10 (×4): qty 1

## 2022-08-10 MED ORDER — HEPARIN (PORCINE) 25000 UT/250ML-% IV SOLN
1250.0000 [IU]/h | INTRAVENOUS | Status: DC
  Administered 2022-08-10: 1100 [IU]/h via INTRAVENOUS
  Administered 2022-08-11 – 2022-08-17 (×6): 1400 [IU]/h via INTRAVENOUS
  Filled 2022-08-10 (×9): qty 250

## 2022-08-10 MED ORDER — SENNOSIDES-DOCUSATE SODIUM 8.6-50 MG PO TABS
1.0000 | ORAL_TABLET | Freq: Every day | ORAL | Status: DC
  Administered 2022-08-10 – 2022-08-13 (×4): 1 via ORAL
  Filled 2022-08-10 (×4): qty 1

## 2022-08-10 NOTE — Progress Notes (Signed)
Family member requested to speak with the Charge RT in regards to breathing treatment from previous evening not being given.  I spoke with patient's son at bedside and advised I was unable to give a reason that breathing treatment was not given but the treatment would not be the reason the BiPAP was needed.  I advised that the BiPAP was for  his mother's oxygenation status.  Patient had a desaturation into the 60's early this AM and was requiring 85% oxygen on the BiPAP.  I advised the son of this and that the BiPAP is different from CPAP. I also mentioned to family that when a patient is fluid overloaded we sometimes need to use  BiPAP in conjunction with diuretic to get the fluid off patient, which will essentially help patient not feel SOB and help with oxygenation status.  The son was appreciative for me coming to hear his concerns and taking the time to explain why we were using the BiPAP and HHFNC.

## 2022-08-10 NOTE — Progress Notes (Signed)
FMTS Interim Progress Note  S:Breathing better than when she came in though not at her baseline. Continues to have pain in both legs. Endorses lower back pain, as well. Was not able to transfer out of seat without assistance given pain and difficulty moving her legs.  O: BP 138/86 (BP Location: Right Arm)   Pulse 99   Temp 99.2 F (37.3 C) (Oral)   Resp 18   Ht _0  (1.575 m)   Wt 116.1 kg   SpO2 96%   BMI 46.81 kg/m   General: Alert and oriented, in NAD Skin: Warm, dry, and intact HEENT: NCAT, EOM grossly normal, midline nasal septum Cardiac: RRR, no m/r/g appreciated Respiratory: Rhonchi in left upper lung field posteriorly, breathing and speaking comfortably in full sentences on 20L HFNC Extremities/Neurological: Strength 3/5 in RLE and 4/5 in LLE, sensation intact throughout LE, TTP throughout entire LE bilaterally without erythema or excessive warmth  A/P: Acute respiratory failure with hypoxia (HCC) Stable. Likely due to severe PAH, TR, fluid overload, ?OSA. RT to initiate BiPAP tonight at midnight. Remainder per daily progress note.  Extremity pain Stable. U/LE dopplers negative for DVT. Deconditioning, lumbar spine pathology on differential. Oxycodone 2.5 mg ordered once prn pain. Remainder per daily progress note.  Ethelene Hal, MD 08/10/2022, 9:26 PM PGY-1, [place] [facility] pager [phone]

## 2022-08-10 NOTE — Progress Notes (Signed)
Patient in no distress at this time. Breathing treatment given at this time. Patient will remain on Heated HFNC 20L 50% O2, SATS 96%.

## 2022-08-10 NOTE — Progress Notes (Deleted)
Patient has home CPAP at bedside, placed within patients reach. Patient states she can place on/off when ready. Informed patient that she can call if she decides she needs assistance.

## 2022-08-10 NOTE — Consult Note (Signed)
NAME:  Candice Mclean, MRN:  [id number], DOB:  1946/05/27, LOS: 2 ADMISSION DATE:  08/06/2022, CONSULTATION DATE:  08/10/22 REFERRING MD:  Graciella Freer, CHIEF COMPLAINT:  hypoxia   History of Present Illness:  Candice Mclean is a 76 y.o. 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  She states that she had a mildly productive cough with some chills for several days prior and one sick contact.    In the ED, she was hypoxic to upper 80%'s requiring [place] oxygen, Covid negative,  CT chest with patchy infiltrates suggestive of pneumonia and febrile to 101F.  She was given initial Vanc and Cefepime with IVF and admitted to family medicine team.    Overnight, she had an episode of desaturation and required 15L HFNC, CTA chest negative for PE.  Cardiology was consulted when echo showed signs of severe PAH and tricuspid regurgitation and Lasix 35m bid initiation, consider RH cath. Troponins were trended and flat.   PCCM consulted in this setting   Pt reports a history of asthma, only uses albuterol for this and no recent severe exacerbations.  She has never been a smoker and does not use oxygen at home. Never been diagnosed with COPD or OSA  Pertinent  Medical History   has a past medical history of AF (atrial fibrillation) ([place], CHF (congestive heart failure) ([facility], Coronary artery disease, Diabetes mellitus without complication ([place], Hypertension, and Medtronic pacemaker (2015).   Significant Hospital Events: Including procedures, antibiotic start and stop dates in addition to other pertinent events   9/28 admitted for pneumonia to FMTS 9/29 hypoxic event, PCCM consult, on 15L [place].  CTA negative for PE  Interim History / Subjective:   Patient continues to complain of fatigue and sensitive extremities to the touch.  Chills yesterday.  Remains  on HFNC 20L 50%.  Daughter is at bedside.  Objective   Blood pressure 134/79, pulse 81, temperature 98.3 F (36.8 C), temperature source Axillary, resp. rate 20, height _0  (1.575 m), weight 116.1 kg, SpO2 96 %.    FiO2 (%):  [50 %-85 %] 50 %   Intake/Output Summary (Last 24 hours) at 08/10/2022 1516 Last data filed at 08/10/2022 0810 Gross per 24 hour  Intake 240 ml  Output 1750 ml  Net -1510 ml   Filed Weights   08/07/22 1337 08/09/22 0405  Weight: 115.7 kg 116.1 kg   General:  elderly woman, fatigued appearing, resting in bed in no distress HEENT: MM pink/moist, sclera anicteric  Neuro: awake and alert, oriented and non-focal CV: s1s2 rrr, no m/r/g PULM:  decreased air entry in bilateral bases, no significant wheezing or rhonchi GI: soft, non-tender Extremities: warm/dry, trace pre-tibial edema  Skin: no rashes or lesions  Resolved Hospital Problem list     Assessment & Plan:   Acute Hypoxic Respiratory Failure  Viral pneumonia due to rhino virua ?Pneumonitis  ?Pulmonary Hypertension History of Asthma  Viral panel + for rhinovirus and CT chest with patchy infiltrates Echo with preserved EF and severe tricuspid regurgitation and mild RV enlargement Procal 0.12 > 0.10 -Cardiology following and are diuresing, considering RHC -oxygen requirement has been slowly down trending -Will order sputum culture for productive cough - Follow up urine strep and legionella  - Change antibiotics to ceftriaxone for 4 days + azithromycin for atypical coverage for 5 days -Incentive spirometer -  she is not currently wheezing, continue prn duonebs.  If worsening consider steroids for severe pneumonia -Agree with outpatient sleep study to evaluate for sleep disordered breathing - ok for CPAP/Bipap at night while in hospital but if patient not tolerating it well, ok to stay on [place] at night  PCCM will continue to follow  Best Practice (right click and "Reselect all SmartList  Selections" daily)   Per primary  Labs   CBC: Recent Labs  Lab 08/06/22 2100 08/08/22 0317 08/09/22 0438 08/10/22 1001  WBC 6.0 6.6 7.0 7.3  NEUTROABS 4.2  --   --   --   HGB 12.8 11.0* 11.1* 11.9*  HCT 37.5 33.1* 32.1* 34.4*  MCV 82.2 84.0 80.7 79.8*  PLT 221 190 187 643    Basic Metabolic Panel: Recent Labs  Lab 08/06/22 2100 08/08/22 0317 08/09/22 0438 08/10/22 1001  NA 139 138 136 136  K 4.4 3.9 3.7 3.7  CL 102 102 98 98  CO2 _0 GLUCOSE 138* 128* 140* 147*  BUN 24* _1 CREATININE 1.28* 1.11* 1.16* 1.05*  CALCIUM 10.2 9.2 9.5 9.3  MG  --  1.9 1.7 1.9   GFR: Estimated Creatinine Clearance: 57.6 mL/min (A) (by C-G formula based on SCr of 1.05 mg/dL (H)). Recent Labs  Lab 08/06/22 2100 08/07/22 1450 08/08/22 0317 08/09/22 0438 08/10/22 1001  PROCALCITON  --   --  0.12 0.10  --   WBC 6.0  --  6.6 7.0 7.3  LATICACIDVEN  --  1.1  --   --   --     Liver Function Tests: Recent Labs  Lab 08/06/22 2100  AST 26  ALT 20  ALKPHOS 82  BILITOT 1.3*  PROT 9.0*  ALBUMIN 4.1   Recent Labs  Lab 08/06/22 2100  LIPASE 41   No results for input(s): "AMMONIA" in the last 168 hours.  ABG    Component Value Date/Time   HCO3 29.2 (H) 08/09/2022 1134   O2SAT 100 08/09/2022 1134     Coagulation Profile: Recent Labs  Lab 08/08/22 0317 08/09/22 0438 08/10/22 1001  INR 2.5* 2.4* 1.7*    Cardiac Enzymes: No results for input(s): "CKTOTAL", "CKMB", "CKMBINDEX", "TROPONINI" in the last 168 hours.  HbA1C: Hgb A1c MFr Bld  Date/Time Value Ref Range Status  08/08/2022 03:17 AM 6.7 (H) 4.8 - 5.6 % Final    Comment:    (NOTE) Pre diabetes:          5.7%-6.4%  Diabetes:              >6.4%  Glycemic control for   <7.0% adults with diabetes     CBG: Recent Labs  Lab 08/09/22 1202 08/09/22 1708 08/09/22 2111 08/10/22 0822 08/10/22 1151  GLUCAP 141* 195* 190* 161* 149*    Review of Systems:   Please see the history of present  illness. All other systems reviewed and are negative    Past Medical History:  She,  has a past medical history of AF (atrial fibrillation) ([place]), CHF (congestive heart failure) ([place]), Coronary artery disease, Diabetes mellitus without complication ([place]), Hypertension, and Medtronic pacemaker (2015).   Surgical History:   Past Surgical History:  Procedure Laterality Date   CARDIAC CATHETERIZATION  2013   No significant CAD     Social History:   reports that she has never smoked. She has never used smokeless tobacco. She reports that she does not drink alcohol and does not use drugs.  Family History:  Her family history includes Heart disease in her mother; Stroke in her father.   Allergies Allergies  Allergen Reactions   Versed [Midazolam] Hives   Tylenol With Codeine #3 [Acetaminophen-Codeine] Palpitations     Home Medications  Prior to Admission medications   Medication Sig Start Date End Date Taking? Authorizing Provider  acetaminophen (TYLENOL) 500 MG tablet Take 1 tablet (500 mg total) by mouth every 6 (six) hours as needed. 08/07/22  Yes Plunkett, Loree Fee, MD  albuterol (PROVENTIL HFA;VENTOLIN HFA) 108 (90 Base) MCG/ACT inhaler Inhale 2 puffs into the lungs every 2 (two) hours as needed for wheezing or shortness of breath. 01/10/17  Yes Elwin Mocha, MD  amLODipine (NORVASC) 10 MG tablet Take 10 mg by mouth daily. 07/12/22  Yes [provider]  aspirin EC 81 MG tablet Take 81 mg by mouth daily.   Yes [provider]  atorvastatin (LIPITOR) 80 MG tablet Take 80 mg by mouth daily.   Yes [provider]  furosemide (LASIX) 20 MG tablet Take 20 mg by mouth daily.   Yes [provider]  JARDIANCE 25 MG TABS tablet Take 25 mg by mouth daily. 05/28/22  Yes [provider]  latanoprost (XALATAN) 0.005 % ophthalmic solution Place 1 drop into both eyes at bedtime.   Yes [provider]  lisinopril (ZESTRIL) 40 MG tablet Take 40 mg  by mouth daily. 05/28/22  Yes [provider]  magnesium oxide (MAG-OX) 400 (241.3 Mg) MG tablet Take 400-800 mg by mouth 2 (two) times daily. Two tablets in the morning and 1 tablet in the evening   Yes [provider]  metFORMIN (GLUCOPHAGE) 500 MG tablet Take 1,000 mg by mouth daily with breakfast.   Yes [provider]  metoprolol tartrate (LOPRESSOR) 25 MG tablet Take 25 mg by mouth 2 (two) times daily.   Yes [provider]  potassium chloride SA (KLOR-CON M) 20 MEQ tablet Take 20 mEq by mouth daily. 07/12/22  Yes [provider]  warfarin (COUMADIN) 5 MG tablet Take 5 mg by mouth See admin instructions. Take 5 mg by mouth daily except take 7.5 mg on Sunday, Monday, and Thursday.   Yes [provider]     Critical care time: n/a    Freda Jackson, MD [place] Office: [phone]   See Amion for personal pager PCCM on call pager [phone] until 7pm. Please call [facility] 7p-7a. [phone]

## 2022-08-10 NOTE — Assessment & Plan Note (Deleted)
Had bowel movement in hospital.  Continue bowel regimen -Miralax 17 g BID -Senna 1 tablet BID

## 2022-08-10 NOTE — Progress Notes (Signed)
ANTICOAGULATION CONSULT NOTE - Follow Up Consult  Pharmacy Consult for heparin drip Indication: atrial fibrillation  Allergies  Allergen Reactions   Versed [Midazolam] Hives   Tylenol With Codeine #3 [Acetaminophen-Codeine] Palpitations    Patient Measurements: Height: _0  (157.5 cm) Weight: 116.1 kg (255 lb 15.3 oz) IBW/kg (Calculated) : 50.1 Heparin Dosing Weight: 78.5 kg  Vital Signs: Temp: 98.8 F (37.1 C) (10/01 0408) Temp Source: Axillary (10/01 0408) BP: 120/97 (10/01 0408) Pulse Rate: 82 (10/01 0751)  Labs: Recent Labs    08/08/22 0317 08/09/22 0438 08/09/22 1134 08/09/22 1312 08/10/22 1001  HGB 11.0* 11.1*  --   --  11.9*  HCT 33.1* 32.1*  --   --  34.4*  PLT 190 187  --   --  231  LABPROT 27.1* 25.8*  --   --  20.0*  INR 2.5* 2.4*  --   --  1.7*  CREATININE 1.11* 1.16*  --   --  1.05*  TROPONINIHS  --   --  14 13  --     Estimated Creatinine Clearance: 57.6 mL/min (A) (by C-G formula based on SCr of 1.05 mg/dL (H)).   Medications:  Infusions:   ceFEPime (MAXIPIME) IV 2 g (08/10/22 0552)    Assessment: ER is a 75YOF requiring anticoagulation for A fib. Pharmacy was consulted to initiate heparin when INR <2. Pt takes warfarin 7.5 mg on Sun/Mon/Thurs and 5 mg on all other days (42.5 mg/week), though has not taken warfarin over past 2 days. INR is currently therapeutic at 2.5 for goal 2-3. Hgb has down trended from 11.9, stable. Plt are wnl. Speech has cleared pt for PO meds, though pt may undergo RHC to evaluate severe pulmonary HTN during this hospitalization making heparin the preferred anticoagulation strategy vs restarting home warfarin. Will continue to follow cardiology and pulmonology recs.  Goal of Therapy:  Heparin level 0.3-0.7 units/ml Monitor platelets by anticoagulation protocol: Yes   Plan:  Start heparin infusion at 1100 units/hr Check anti-Xa level in 8 hours and daily while on heparin Continue to monitor H&H and platelets  Vicenta Dunning, PharmD  PGY1 Pharmacy Resident

## 2022-08-10 NOTE — Progress Notes (Signed)
Cardiology Progress Note  Patient ID: Candice Mclean MRN: [id number] DOB: [birth year] Date of Encounter: 08/10/2022  Primary Cardiologist: Sanda Klein, MD  Subjective   Net negative another 1.6L overnight-  creatinine improving with diuresis at 1.05 today. INR 1.7 - heparin bridge for possible cath this coming week.  Inpatient Medications  Scheduled Meds:  diclofenac Sodium  2 g Topical QID   furosemide  40 mg Intravenous BID   influenza vaccine adjuvanted  0.5 mL Intramuscular Tomorrow-1000   insulin aspart  0-9 Units Subcutaneous TID WC   ipratropium-albuterol  3 mL Nebulization BID   metoprolol tartrate  25 mg Oral BID   pantoprazole (PROTONIX) IV  40 mg Intravenous Q12H   polyethylene glycol  17 g Oral Daily   senna-docusate  1 tablet Oral Daily   spironolactone  12.5 mg Oral Daily   Continuous Infusions:  ceFEPime (MAXIPIME) IV 2 g (08/10/22 0552)   heparin     PRN Meds: acetaminophen, lip balm, phenol, sodium chloride   Vital Signs   Vitals:   08/09/22 2218 08/10/22 0000 08/10/22 0408 08/10/22 0751  BP:  (!) 125/90 (!) 120/97   Pulse: 85 95 98 82  Resp: (!) 26 (!) 26 20 (!) 24  Temp:  98.9 F (37.2 C) 98.8 F (37.1 C)   TempSrc:  Axillary Axillary   SpO2: 98% 96% 100% 100%  Weight:      Height:        Intake/Output Summary (Last 24 hours) at 08/10/2022 1120 Last data filed at 08/10/2022 0810 Gross per 24 hour  Intake 240 ml  Output 1750 ml  Net -1510 ml      08/09/2022    4:05 AM 08/07/2022    1:37 PM  Last 3 Weights  Weight (lbs) 255 lb 15.3 oz 255 lb  Weight (kg) 116.1 kg 115.667 kg      Telemetry   Afib with CVR  ECG   N/A  Physical Exam   Vitals:   08/09/22 2218 08/10/22 0000 08/10/22 0408 08/10/22 0751  BP:  (!) 125/90 (!) 120/97   Pulse: 85 95 98 82  Resp: (!) 26 (!) 26 20 (!) 24  Temp:  98.9 F (37.2 C) 98.8 F (37.1 C)   TempSrc:  Axillary Axillary   SpO2: 98% 96% 100% 100%  Weight:      Height:        Intake/Output  Summary (Last 24 hours) at 08/10/2022 1120 Last data filed at 08/10/2022 0810 Gross per 24 hour  Intake 240 ml  Output 1750 ml  Net -1510 ml       08/09/2022    4:05 AM 08/07/2022    1:37 PM  Last 3 Weights  Weight (lbs) 255 lb 15.3 oz 255 lb  Weight (kg) 116.1 kg 115.667 kg    Body mass index is 46.81 kg/m.  General: Well nourished, well developed, in no acute distress Head: Atraumatic, normal size  Eyes: PEERLA, EOMI  Neck: Supple, JVD 6 cmH20 Endocrine: No thryomegaly Cardiac: Normal S1, S2; irregular rhythm Lungs: Clear to auscultation bilaterally, no wheezing, rhonchi or rales  Abd: Soft, nontender, no hepatomegaly  Ext: No edema, pulses 2+ Musculoskeletal: No deformities, BUE and BLE strength normal and equal Skin: Warm and dry, no rashes   Neuro: Alert and oriented to person, place, time, and situation, CNII-XII grossly intact, no focal deficits  Psych: Normal mood and affect   Labs  High Sensitivity Troponin:   Recent Labs  Lab 08/06/22 2100  08/07/22 0620 08/09/22 1134 08/09/22 1312  TROPONINIHS _0 Cardiac EnzymesNo results for input(s): "TROPONINI" in the last 168 hours. No results for input(s): "TROPIPOC" in the last 168 hours.  Chemistry Recent Labs  Lab 08/06/22 2100 08/08/22 0317 08/09/22 0438 08/10/22 1001  NA 139 138 136 136  K 4.4 3.9 3.7 3.7  CL 102 102 98 98  CO2 _1 GLUCOSE 138* 128* 140* 147*  BUN 24* _2 CREATININE 1.28* 1.11* 1.16* 1.05*  CALCIUM 10.2 9.2 9.5 9.3  PROT 9.0*  --   --   --   ALBUMIN 4.1  --   --   --   AST 26  --   --   --   ALT 20  --   --   --   ALKPHOS 82  --   --   --   BILITOT 1.3*  --   --   --   GFRNONAA 44* 52* 50* 56*  ANIONGAP _3 Hematology Recent Labs  Lab 08/08/22 0317 08/09/22 0438 08/10/22 1001  WBC 6.6 7.0 7.3  RBC 3.94 3.98 4.31  HGB 11.0* 11.1* 11.9*  HCT 33.1* 32.1* 34.4*  MCV 84.0 80.7 79.8*  MCH 27.9 27.9 27.6  MCHC 33.2 34.6 34.6  RDW 21.0* 19.9*  19.4*  PLT 190 187 231   BNP Recent Labs  Lab 08/07/22 1008  BNP 136.0*    DDimer No results for input(s): "DDIMER" in the last 168 hours.   Radiology  DG CHEST PORT 1 VIEW  Result Date: 08/09/2022 CLINICAL DATA:  Short of breath. EXAM: PORTABLE CHEST 1 VIEW COMPARISON:  08/07/2022 and older exams.  CT, 08/08/2022. FINDINGS: Mild stable cardiomegaly. Stable left anterior chest wall dual lead pacemaker. Lung base opacities, left greater than right, appear improved from the prior exams. No new lung abnormalities. No convincing pleural effusion and no pneumothorax. IMPRESSION: 1. Improved lung base opacities compared to the prior exams, likely improved atelectasis. 2. No new abnormalities.  No overt pulmonary edema. Electronically Signed   By: Lajean Manes M.D.   On: 08/09/2022 13:34    Cardiac Studies  TTE 08/07/2022  1. Left ventricular ejection fraction, by estimation, is 55 to 60%. The  left ventricle has normal function. The left ventricle has no regional  wall motion abnormalities. Left ventricular diastolic parameters are  indeterminate.   2. Right ventricular systolic function is normal. The right ventricular  size is mildly enlarged. There is severely elevated pulmonary artery  systolic pressure. The estimated right ventricular systolic pressure is  91.6 mmHg.   3. Left atrial size was moderately dilated.   4. Right atrial size was moderately dilated.   5. The mitral valve is grossly normal. No evidence of mitral valve  regurgitation.   6. Appears RV lead is impacting coaptation of the tricuspid valve  leaflets. Tricuspid valve regurgitation is severe.   7. The aortic valve is tricuspid. Aortic valve regurgitation is not  visualized.   8. The inferior vena cava is dilated in size with <50% respiratory  variability, suggesting right atrial pressure of 15 mmHg.   Patient Profile  Candice Mclean is a 76 y.o. female with atrial fibrillation status post pacemaker, morbid  obesity, asthma who was admitted on 08/07/2022 for dysphagia and vomiting.  Cardiology was consulted for echocardiogram which shows severe pulmonary hypertension and RV dysfunction.  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 -She presents with acute onset of dysphagia and nausea.  She also was found to have hypoxic respiratory failure.  Her echocardiogram is interesting in that she has severe pulm hypertension.  No acute PE.  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  Possible pneumonia as well. -She has elevated JVD.  She has some element of right heart failure on my examination.  Would recommend we start Lasix 40 mg IV twice daily to see if this helps her.  She could be experiencing hepatic congestion this could explain some of her symptoms. -I also start Aldactone 12.5 mg daily to help with her potassium. -Unclear how much of this is acute versus chronic.  Her RV clearly looks like more of a chronic issue. -I am not convinced she has pneumonia.  We will check procalcitonin's. -Her oxygen requirement is quite significant compared to what was seen on her CT. -She could have undiagnosed sleep apnea.  She may have underlying lung disease.  This could also just represent primary pulm hypertension of some undisclosed etiology.  I think pulmonary consultation would be beneficial.  I suspect she will end up needing a left and right heart catheterization this admission if she continues to have low oxygen levels.  She has not grossly volume overloaded and has no evidence of pulmonary edema. -Would recommend diuresis over the weekend.  Would put her on a heparin drip and hold Coumadin.  Start her heparin drip once her INR is below 2. -We will check procalcitonin as well.  - Net negative 1.6L overnight - now on heparin for possible L/RHC next week.  INR 1.7. Creatinine  improving with diuresis. Potassium stable at 3.7. Continue diuresis.  #Atrial fibrillation, permanent -Hold Coumadin.  Transition heparin once INR is below 2.  Rate seems to be well controlled. -Restart home metoprolol    For questions or updates, please contact [place] Please consult [email or web address] for contact info under   Pixie Casino, MD, FACC, [place] Director of the [facility] Diplomate of the American Board of Clinical Lipidology Attending Cardiologist  Direct Dial: [phone]  Fax: [phone]  Website:  [email or web address]  08/10/2022 11:20 AM

## 2022-08-10 NOTE — Progress Notes (Signed)
Daily Progress Note Intern Pager: [phone]  Patient name: Candice Mclean Medical record number: [id number] Date of birth: 1946-03-31 Age: 76 y.o. Gender: female  Primary Care Provider: Janene Madeira, Vernice Jefferson, MD Consultants: GI, cardiology Code Status: Full code  Pt Overview and Major Events to Date:  9/28 admitted to [facility] 9/30 Rapid response hypoxia > BiPAP, CXR bilateral opacities, ABG 7.44 pH  Assessment and Plan:  76 y.o. female presenting with sudden onset dysphagia with associated vomiting and subsequently developed SO.Marland Kitchen Pertinent PMH/PSH includes  first degree AVB, thyroid nodule, asthma, renal artery embolism, syncope, pAF, and SSS s/p Medtronic dual chamber pacemaker (functioning in VVIR mode since 12/2018).  * Vomiting No further episodes of vomiting. Per GI, no further intervention indicated at this time -Protonix 34m IV BID> needs PO transition when appropriate - regular diet with monitoring aspiration risk   Acute respiratory failure with hypoxia (HCC) Suspect combination of severe PAH and tricuspid regurgitation + atelectasis alongside initial fluid resuscitation patient had received in hospital. CTPE negative for PE on 9/29. Overnight required high flow nasal cannula up to 20 L currently on BiPAP at 70% FiO2.  rhino/entero +.  Pro cal negative. -follow up pulm recs -Continue Cefepime per pulm  -PRN Duonebs q4 for wheezing -Incentive spirometry, pulmonary hygeine -Monitor oxygen saturation, wean as tolerated  -strep pneum and legionella not collected, I have reached out to nursing    Pulmonary hypertension, primary ([facility]  Cardiology consulted and recommended no additional therapy at this time. 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 Monitor, rate controlled currently. -INR pending for today -Heparin  per pharmacy when INR trends <2 in anticipation of potential cardiac procedure this week (right heart cath?) -Home metoprolol 216mBID   CHF (congestive heart failure) (HCC) Elevated JVD. No new weight today. Net 3.7 L down. -Lasix 40 mg IV twice daily  -Aldactone 12.5 mg daily   Diabetes (HCC) BGL 141-195.  -sSSI -CBG q6  Abdominal pain Patient has right lower quadrant tenderness to palpation on examination.  She says she has not had a bowel movement since Wednesday of last week.  Abdomen soft on examination, no rebound tenderness.  -Miralax  -Senna -If abdominal pain worsening low threshold to obtain imaging  Extremity pain Patient complaining of right upper extremity pain all over her arm as well as bilateral lower extremity pain worse on the right side.  No significant swelling seen on examination.  No erythema or warmth seen on examination.  There is significant tenderness to palpation over bony prominences.  We have transition to heparin in the hospital per cardiology recommendations due to potential catheterization procedure this week.  Patient has had a history of PE and DVT per family in room. Well's score of 3. -consider upper and lower extremity doppler today for evaluation -discuss with RN to collect INR   FEN/GI: regular PPx: SCDs (not in place when I was in the room)-heparin if INR <2 Dispo:Pending PT recommendations  pending clinical improvement . Barriers include clinical improvement.   Subjective:  Patient says her biggest complaint right now is extremity pain worst on her right arm where her IV site was possibly infiltrated as well as her right leg.  Daughter says that it is hard to shift her extremities on exam due to pain  Objective: Temp:  [98.4 F (36.9 C)-100.4 F (  38 C)] 98.8 F (37.1 C) (10/01 0408) Pulse Rate:  [82-112] 82 (10/01 0751) Resp:  [16-29] 24 (10/01 0751) BP: (120-134)/(76-97) 120/97 (10/01 0408) SpO2:  [65 %-100 %] 100 % (10/01  0751) FiO2 (%):  [60 %-100 %] 70 % (10/01 0751) Physical Exam: General: NAD, BiPAP in place, alert and responsive to all questions Cardiovascular: Regular rate, 2+ radial pulses bilaterally Respiratory: Slight increased work of breathing on BiPAP with belly breathing, no suprasternal retractions or nasal flaring, speaking full sentences Abdomen: Soft, tender to palpation in right lower quadrant, no rebound tenderness no guarding Rovsing sign neg Extremities: No lower extremity edema, tenderness to palpation over anterior tibia on right lower extremity, some tenderness to palpation behind calves bilaterally, no erythema or warmth  Laboratory: Most recent CBC Lab Results  Component Value Date   WBC 7.0 08/09/2022   HGB 11.1 (L) 08/09/2022   HCT 32.1 (L) 08/09/2022   MCV 80.7 08/09/2022   PLT 187 08/09/2022   Most recent BMP    Latest Ref Rng & Units 08/09/2022    4:38 AM  BMP  Glucose 70 - 99 mg/dL 140   BUN 8 - 23 mg/dL 16   Creatinine 0.44 - 1.00 mg/dL 1.16   Sodium 135 - 145 mmol/L 136   Potassium 3.5 - 5.1 mmol/L 3.7   Chloride 98 - 111 mmol/L 98   CO2 22 - 32 mmol/L 23   Calcium 8.9 - 10.3 mg/dL 9.5     Other pertinent labs    Imaging/Diagnostic Tests: DG CHEST PORT 1 VIEW  Result Date: 08/09/2022 CLINICAL DATA:  Short of breath. EXAM: PORTABLE CHEST 1 VIEW COMPARISON:  08/07/2022 and older exams.  CT, 08/08/2022. FINDINGS: Mild stable cardiomegaly. Stable left anterior chest wall dual lead pacemaker. Lung base opacities, left greater than right, appear improved from the prior exams. No new lung abnormalities. No convincing pleural effusion and no pneumothorax. IMPRESSION: 1. Improved lung base opacities compared to the prior exams, likely improved atelectasis. 2. No new abnormalities.  No overt pulmonary edema. Electronically Signed   By: Lajean Manes M.D.   On: 08/09/2022 13:34    Gerrit Heck, MD 08/10/2022, 9:13 AM  PGY-2, [place] Intern  pager: [phone], text pages welcome Secure chat group [place]

## 2022-08-10 NOTE — Progress Notes (Signed)
Pharmacy Antibiotic Note  Candice Mclean is a 76 y.o. female admitted on 08/06/2022 with acute respiratory failure with concern for pneumonia.  Pharmacy has been consulted for cefepime dosing.  PMH: afib (pacemaker), CHF.  Patient reports breathing is much improved from desaturation episode 9/30 midday. Her white blood cell count is not elevated from 9/30 AM labs. She did have a fever of 100.4 on 9/30 at 1130am. Patient is on day 3 of abx.   Plan: Continue Cefepime 2g q12h - dose appropriate per renal function  Monitor for signs of clinical improvement, fever curve, cultures, WBC.  Height: 5' 2" (157.5 cm) Weight: 116.1 kg (255 lb 15.3 oz) IBW/kg (Calculated) : 50.1  Temp (24hrs), Avg:99.2 F (37.3 C), Min:98.4 F (36.9 C), Max:100.4 F (38 C)  Recent Labs  Lab 08/06/22 2100 08/07/22 1450 08/08/22 0317 08/09/22 0438  WBC 6.0  --  6.6 7.0  CREATININE 1.28*  --  1.11* 1.16*  LATICACIDVEN  --  1.1  --   --      Estimated Creatinine Clearance: 52.2 mL/min (A) (by C-G formula based on SCr of 1.16 mg/dL (H)).    Allergies  Allergen Reactions   Versed [Midazolam] Hives   Tylenol With Codeine #3 [Acetaminophen-Codeine] Palpitations    Antimicrobials this admission: 9/28 Vanc 1546m IV x 1  Cefepime 9/28 >>   Microbiology results: 9/28 BCx: NG x 2 days  9/28 UCx: suggested recollection  9/29 Sputum: sent   Covid test: negative 9/29: MRSA nasal: negative   Thank you for allowing pharmacy to be a part of this patient's care.  HVicenta Dunning PharmD  PGY1 Pharmacy Resident

## 2022-08-10 NOTE — Progress Notes (Signed)
ANTICOAGULATION CONSULT NOTE  Pharmacy Consult for heparin drip Indication: atrial fibrillation  Allergies  Allergen Reactions   Versed [Midazolam] Hives   Tylenol With Codeine #3 [Acetaminophen-Codeine] Palpitations    Patient Measurements: Height: _0  (157.5 cm) Weight: 116.1 kg (255 lb 15.3 oz) IBW/kg (Calculated) : 50.1  Heparin Dosing Weight: 79 kg  Vital Signs: Temp: 98.3 F (36.8 C) (10/01 1148) Temp Source: Axillary (10/01 1148) BP: 134/79 (10/01 1148) Pulse Rate: 81 (10/01 1347)  Labs: Recent Labs    08/08/22 0317 08/09/22 0438 08/09/22 1134 08/09/22 1312 08/10/22 1001 08/10/22 1834  HGB 11.0* 11.1*  --   --  11.9*  --   HCT 33.1* 32.1*  --   --  34.4*  --   PLT 190 187  --   --  231  --   LABPROT 27.1* 25.8*  --   --  20.0*  --   INR 2.5* 2.4*  --   --  1.7*  --   HEPARINUNFRC  --   --   --   --   --  0.16*  CREATININE 1.11* 1.16*  --   --  1.05*  --   TROPONINIHS  --   --  14 13  --   --     Estimated Creatinine Clearance: 57.6 mL/min (A) (by C-G formula based on SCr of 1.05 mg/dL (H)).   Assessment: 75 YOF requiring anticoagulation for A fib. Pharmacy was consulted to initiate heparin when INR <2. Pt takes warfarin 7.5 mg on Sun/Mon/Thurs and 5 mg on all other days (42.5 mg/week), though has not taken warfarin over past 2 days.  Heparin level subtherapeutic 0.16 at 6-hours post heparin initiation.  CBC stable, no issues with heparin infusion or bleeding reported.   Goal of Therapy:  Heparin level 0.3-0.7 units/ml Monitor platelets by anticoagulation protocol: Yes   Plan:  Increase heparin infusion to 1250 units/hr Check heparin level in 8 hours and daily while on heparin Continue to monitor H&H and platelets    Thank you for allowing pharmacy to be a part of this patient's care.  Ardyth Harps, PharmD Clinical Pharmacist

## 2022-08-10 NOTE — Progress Notes (Signed)
Upper and lower extremity venous duplex has been completed.   Preliminary results in CV Proc.   Jinny Blossom Wania Longstreth 08/10/2022 1:37 PM

## 2022-08-10 NOTE — Assessment & Plan Note (Addendum)
Still experiencing low back and bilateral leg pain.  Some improvement with gabapentin, can consider going up on medication. - 200 mg gabapentin 3 times daily

## 2022-08-11 DIAGNOSIS — R112 Nausea with vomiting, unspecified: Secondary | ICD-10-CM | POA: Diagnosis not present

## 2022-08-11 DIAGNOSIS — I272 Pulmonary hypertension, unspecified: Secondary | ICD-10-CM

## 2022-08-11 DIAGNOSIS — I5032 Chronic diastolic (congestive) heart failure: Secondary | ICD-10-CM

## 2022-08-11 DIAGNOSIS — R1110 Vomiting, unspecified: Secondary | ICD-10-CM | POA: Diagnosis not present

## 2022-08-11 DIAGNOSIS — I2721 Secondary pulmonary arterial hypertension: Secondary | ICD-10-CM | POA: Diagnosis not present

## 2022-08-11 DIAGNOSIS — J9601 Acute respiratory failure with hypoxia: Secondary | ICD-10-CM | POA: Diagnosis not present

## 2022-08-11 LAB — CBC
HCT: 34.6 % — ABNORMAL LOW (ref 36.0–46.0)
Hemoglobin: 12.2 g/dL (ref 12.0–15.0)
MCH: 28.2 pg (ref 26.0–34.0)
MCHC: 35.3 g/dL (ref 30.0–36.0)
MCV: 80.1 fL (ref 80.0–100.0)
Platelets: 244 10*3/uL (ref 150–400)
RBC: 4.32 MIL/uL (ref 3.87–5.11)
RDW: 19.1 % — ABNORMAL HIGH (ref 11.5–15.5)
WBC: 8 10*3/uL (ref 4.0–10.5)
nRBC: 0 % (ref 0.0–0.2)

## 2022-08-11 LAB — PROTIME-INR
INR: 1.7 — ABNORMAL HIGH (ref 0.8–1.2)
Prothrombin Time: 20.1 seconds — ABNORMAL HIGH (ref 11.4–15.2)

## 2022-08-11 LAB — BASIC METABOLIC PANEL
Anion gap: 13 (ref 5–15)
Anion gap: 10 (ref 5–15)
BUN: 17 mg/dL (ref 8–23)
BUN: 19 mg/dL (ref 8–23)
CO2: 27 mmol/L (ref 22–32)
CO2: 27 mmol/L (ref 22–32)
Calcium: 9 mg/dL (ref 8.9–10.3)
Calcium: 8.9 mg/dL (ref 8.9–10.3)
Chloride: 93 mmol/L — ABNORMAL LOW (ref 98–111)
Chloride: 94 mmol/L — ABNORMAL LOW (ref 98–111)
Creatinine, Ser: 1.09 mg/dL — ABNORMAL HIGH (ref 0.44–1.00)
Creatinine, Ser: 1.15 mg/dL — ABNORMAL HIGH (ref 0.44–1.00)
GFR, Estimated: 54 mL/min — ABNORMAL LOW (ref >60)
GFR, Estimated: 50 mL/min — ABNORMAL LOW (ref >60)
Glucose, Bld: 157 mg/dL — ABNORMAL HIGH (ref 70–99)
Glucose, Bld: 135 mg/dL — ABNORMAL HIGH (ref 70–99)
Potassium: 3.4 mmol/L — ABNORMAL LOW (ref 3.5–5.1)
Potassium: 4.3 mmol/L (ref 3.5–5.1)
Sodium: 133 mmol/L — ABNORMAL LOW (ref 135–145)
Sodium: 131 mmol/L — ABNORMAL LOW (ref 135–145)

## 2022-08-11 LAB — GLUCOSE, CAPILLARY
Glucose-Capillary: 135 mg/dL — ABNORMAL HIGH (ref 70–99)
Glucose-Capillary: 223 mg/dL — ABNORMAL HIGH (ref 70–99)
Glucose-Capillary: 137 mg/dL — ABNORMAL HIGH (ref 70–99)

## 2022-08-11 LAB — HEPARIN LEVEL (UNFRACTIONATED)
Heparin Unfractionated: 0.27 IU/mL — ABNORMAL LOW (ref 0.30–0.70)
Heparin Unfractionated: 0.39 IU/mL (ref 0.30–0.70)
Heparin Unfractionated: 0.52 IU/mL (ref 0.30–0.70)

## 2022-08-11 MED ORDER — OXYCODONE HCL 5 MG PO TABS
2.5000 mg | ORAL_TABLET | Freq: Once | ORAL | Status: AC | PRN
  Administered 2022-08-11: 2.5 mg via ORAL
  Filled 2022-08-11: qty 1

## 2022-08-11 MED ORDER — METOPROLOL TARTRATE 25 MG PO TABS
25.0000 mg | ORAL_TABLET | Freq: Once | ORAL | Status: AC
  Administered 2022-08-11: 25 mg via ORAL
  Filled 2022-08-11: qty 1

## 2022-08-11 MED ORDER — PANTOPRAZOLE SODIUM 40 MG PO TBEC
40.0000 mg | DELAYED_RELEASE_TABLET | Freq: Two times a day (BID) | ORAL | Status: DC
  Administered 2022-08-11 – 2022-08-18 (×14): 40 mg via ORAL
  Filled 2022-08-11 (×14): qty 1

## 2022-08-11 MED ORDER — POTASSIUM CHLORIDE CRYS ER 20 MEQ PO TBCR
40.0000 meq | EXTENDED_RELEASE_TABLET | Freq: Once | ORAL | Status: AC
  Administered 2022-08-11: 40 meq via ORAL
  Filled 2022-08-11: qty 2

## 2022-08-11 MED ORDER — POTASSIUM CHLORIDE CRYS ER 20 MEQ PO TBCR
40.0000 meq | EXTENDED_RELEASE_TABLET | Freq: Two times a day (BID) | ORAL | Status: DC
  Administered 2022-08-11 (×2): 40 meq via ORAL
  Filled 2022-08-11 (×2): qty 2

## 2022-08-11 MED ORDER — SODIUM CHLORIDE 0.9 % IV SOLN
2.0000 g | Freq: Three times a day (TID) | INTRAVENOUS | Status: AC
  Administered 2022-08-11 – 2022-08-13 (×7): 2 g via INTRAVENOUS
  Filled 2022-08-11 (×6): qty 12.5

## 2022-08-11 MED ORDER — GABAPENTIN 100 MG PO CAPS
100.0000 mg | ORAL_CAPSULE | Freq: Three times a day (TID) | ORAL | Status: DC
  Administered 2022-08-11 – 2022-08-13 (×6): 100 mg via ORAL
  Filled 2022-08-11 (×6): qty 1

## 2022-08-11 NOTE — Progress Notes (Signed)
FMTS Brief Progress Note  S: Patient was asleep upon my arrival. Daughter was at the bedside. Daughter did not have any concerns, was wanting to know if her mother would be placed back on BiPAP tonight.  She seemed to tolerate it well last night and got some good rest with it.  Patient then awoke, reported no concerns.  Overall feels unwell but states that her breathing is fine.  She denies any chest pain or palpitations.   O: BP 114/76 (BP Location: Right Wrist)   Pulse (!) 121   Temp 98.5 F (36.9 C) (Oral)   Resp 18   Ht _0  (1.575 m)   Wt 116.1 kg   SpO2 93%   BMI 46.81 kg/m   Gen: Chronically ill appearing Resp: On 20 HHNC, normal respiratory effort  Cardio: Afib with RVR, rate 120-140s in the room   A/P: Afib with RVR  HR persistently 120-140s in the room. She is asymptomatic. Appears her rate was better controlled earlier on in the day. She received both doses of her Metoprolol.  -Will give additional 25 mg Metoprolol  -Monitor HR, BP -Continue tele   Fever No recurrent fevers since 1453.  -Continue cefepime  Hypoxia  D/w RN who will talk with RT re: placing patient on BiPAP tonight. Plan for [facility] tomorrow AM. -NPO at MN  -Continue heparin gtt -BiPAP at night -Continuous pulse ox    - Orders reviewed. Labs for AM ordered, which was adjusted as needed.   Sharion Settler, DO 08/11/2022, 10:33 PM PGY-3,  Family Medicine Night Resident  Please page [phone] with questions.

## 2022-08-11 NOTE — Progress Notes (Signed)
Pt instructed by RT on how to use  flutter valve with read back and demonstration. Pt tolerated well, RT will monitor.

## 2022-08-11 NOTE — Progress Notes (Signed)
  Transition of Care [facility]) Screening Note   Patient Details  Name: Candice Mclean Date of Birth: October 06, 1946   Transition [facility]) CM/SW Contact:    Cyndi Bender, RN Phone Number: 08/11/2022, 9:35 AM    Transition of Care Department [facility]) has reviewed patient and no TOC needs have been identified at this time. We will continue to monitor patient advancement through interdisciplinary progression rounds. If new patient transition needs arise, please place a TOC consult.

## 2022-08-11 NOTE — Progress Notes (Signed)
Daily Progress Note Intern Pager: [phone]  Patient name: Candice Mclean Medical record number: [id number] Date of birth: Jun 15, 1946 Age: 76 y.o. Gender: female  Primary Care Provider: Janene Madeira, Vernice Jefferson, MD Consultants: GI, cardiology Code Status: Full code  Pt Overview and Major Events to Date:  9/28: Admitted to FMTS 9/30: Rapid response hypoxia, transition to BiPAP, chest x-ray bilateral opacities, ABG 7.44 pH.  Assessment and Plan: 76 year old female presenting with sudden onset dysphagia with associated vomiting and subsequently developed SOB.  Pertinent PMH/PSH includes first-degree AVB, thyroid nodule, asthma, renal artery embolism, syncope, PAF, and SSS s/p Medtronic dual-chamber pacemaker (functioning in VVIR mode since 12/2018).  * Vomiting Resolved. Per GI, no further intervention indicated at this time - Transition to p.o. Protonix - Regular diet with monitoring aspiration risk   Acute respiratory failure with hypoxia ([place]) Patient still on 20 L high flow nasal cannula.  Tolerated BiPAP last night.  Differential includes severe PAH + bicuspid regurgitation, atelectasis/fluid resuscitation, pneumonia, exacerbation of underlying cardiopulmonary disease secondary to rhinovirus.  PE less likely as CTPE negative, upper/lower extremity Doppler without evidence of DVT.  - Ceftriaxone x4 days, azithromycin x5 days for atypical coverage per pulm - BiPAP at night (patient tolerating well) - Pending sputum culture - As needed DuoNebs every 4 for wheezing - Incentive spirometry, pulmonary hygiene - CCM following, we appreciate recs - Monitor oxygen saturation, wean as tolerated  - Contacted Nursing about strep pneum and legionella labs    Pulmonary hypertension, primary [facility])  Cardiology consulted and recommended no additional therapy at this time. 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 Monitor, rate controlled currently. -Heparin -Therapeutic heparin levels -Home metoprolol 73m BID   CHF (congestive heart failure) (HCC) Continue diuresis per cardiology.  Net down 3.6 L -Lasix 40 mg IV every 8 hours -Aldactone 12.5 mg daily   Diabetes (HCC) BGL 141-195.  -sSSI -CBG q6  Abdominal pain Patient has right lower quadrant tenderness to palpation on examination.  She says she has not had a bowel movement since Wednesday of last week.  Abdomen soft on examination, no rebound tenderness.  -Miralax  -Senna  Extremity pain Upper extremity pain is gone.  Patient still has bilateral lower extremity pain, in the setting of lower extremity edema.  Shooting pain from back to bilateral legs.  2.5 mg oxycodone helped pain last night. - Upper/lower extremity Doppler negative for DVT. - 100 mg gabapentin 3 times daily   FEN/GI: Regular PPx: Heparin Dispo:SNF pending clinical improvement .   Subjective:  Patient reports that she is feeling better today in regards to her breathing.  However, she is still having bilateral leg pain.  She has not been able to have a bowel movement, however does not endorse abdominal pain at this time.  Objective: Temp:  [99.2 F (37.3 C)] 99.2 F (37.3 C) (10/01 2032) Pulse Rate:  [81-99] 91 (10/02 1025) Resp:  [18-28] 20 (10/02 1025) BP: (130-138)/(83-86) 130/83 (10/02 1025) SpO2:  [96 %-99 %] 96 % (10/02 0801) FiO2 (%):  [40 %-50 %] 40 % (10/02 0801) Physical Exam: General: Ill-appearing, not in acute distress, pleasant, stable Cardiovascular: Irregular rhythm, tachycardic Respiratory: Bilateral expiratory wheezing, on 20 L nasal cannula Extremities: Mild edema, pedal pulses intact bilaterally Laboratory: Most recent CBC Lab Results  Component Value Date   WBC 8.0 08/11/2022  HGB 12.2 08/11/2022   HCT 34.6 (L) 08/11/2022   MCV 80.1  08/11/2022   PLT 244 08/11/2022   Most recent BMP    Latest Ref Rng & Units 08/11/2022    1:48 AM  BMP  Glucose 70 - 99 mg/dL 157   BUN 8 - 23 mg/dL 17   Creatinine 0.44 - 1.00 mg/dL 1.09   Sodium 135 - 145 mmol/L 133   Potassium 3.5 - 5.1 mmol/L 3.4   Chloride 98 - 111 mmol/L 93   CO2 22 - 32 mmol/L 27   Calcium 8.9 - 10.3 mg/dL 9.0       Imaging/Diagnostic Tests:  VAS [place] UPPER EXTREMITY VENOUS DUPLEX Result Date: 08/10/2022 Summary: RIGHT: - There is no evidence of deep vein thrombosis in the lower extremity. However, portions of this examination were limited- see technologist comments above.  - No cystic structure found in the popliteal fossa.  LEFT: - There is no evidence of deep vein thrombosis in the lower extremity. However, portions of this examination were limited- see technologist comments above.  - No cystic structure found in the popliteal fossa.  *See table(s) above for measurements and observations. Electronically signed by Deitra Mayo MD on 08/10/2022 at 1:59:49 PM.    Final     [name], DO 08/11/2022, 12:01 PM  PGY-1, [place] Intern pager: [phone], text pages welcome Secure chat group [place]

## 2022-08-11 NOTE — Progress Notes (Signed)
Patient taken off BiPAP for a break at this time. Patient tolerated BiPAP well for 4 hours. Patient placed on heated HFNC 20 Lpm, 50% O2. Patient in no distress. SATs 95%. RN made aware.

## 2022-08-11 NOTE — TOC CM/SW Note (Signed)
HF TOC CM received referral for review of BIPAP/NIV for home. [place] to review. [place], Heart Failure TOC CM [phone]

## 2022-08-11 NOTE — Progress Notes (Addendum)
NAME:  Candice Mclean, MRN:  [id number], DOB:  [birth year], LOS: 3 ADMISSION DATE:  08/06/2022, CONSULTATION DATE:  08/11/22 REFERRING MD:  Graciella Freer, CHIEF COMPLAINT:  hypoxia   History of Present Illness:  Candice Mclean is a 76 y.o. 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  She states that she had a mildly productive cough with some chills for several days prior and one sick contact.    In the ED, she was hypoxic to upper 80%'s requiring [place] oxygen, Covid negative,  CT chest with patchy infiltrates suggestive of pneumonia and febrile to 101F.  She was given initial Vanc and Cefepime with IVF and admitted to family medicine team.    Overnight, she had an episode of desaturation and required 15L HFNC, CTA chest negative for PE.  Cardiology was consulted when echo showed signs of severe PAH and tricuspid regurgitation and Lasix 31m bid initiation, consider RH cath. Troponins were trended and flat.   PCCM consulted in this setting   Pt reports a history of asthma, only uses albuterol for this and no recent severe exacerbations.  She has never been a smoker and does not use oxygen at home. Never been diagnosed with COPD or OSA  Pertinent  Medical History   has a past medical history of [place] ([place]rial [place]) ([place], CHF (congestive heart failure) ([place], Coronary artery disease, Diabetes mellitus without complication ([place], Hypertension, and [employer] pacemaker (2015).   Significant Hospital Events: Including procedures, antibiotic start and stop dates in addition to other pertinent events   9/28 admitted for pneumonia to [facility] 9/29 hypoxic event, PCCM consult, on 15L [place].  CTA negative for PE  Interim History / Subjective:  States she is lethargic, supine in bed. Complaining of leg pain Remains on HFNC 15 L  , 40 %. Sats are 96% and RR  is 20 T Max is 99.2 No new imaging    Objective   Blood pressure 130/83, pulse 91, temperature 99.2 F (37.3 C), temperature source Oral, resp. rate 20, height _0  (1.575 m), weight 116.1 kg, SpO2 96 %.    FiO2 (%):  [40 %-50 %] 40 %   Intake/Output Summary (Last 24 hours) at 08/11/2022 1151 Last data filed at 08/11/2022 0105 Gross per 24 hour  Intake 100 ml  Output --  Net 100 ml   Filed Weights   08/07/22 1337 08/09/22 0405  Weight: 115.7 kg 116.1 kg   General:  elderly woman, fatigued appearing, resting in bed in no distress HEENT: MM pink/moist, sclera anicteric  Neuro: awake and alert, oriented and non-focal, MAE x 4 CV: s1s2 rrr, no m/r/g PULM:  decreased air entry in bilateral bases, no significant wheezing or rhonchi GI: soft, non-tender, ND, BS +., Body mass index is 46.81 kg/m. Extremities: warm/dry, trace pre-tibial edema , generalized leg pain R>L Skin: no rashes or lesions  Resolved Hospital Problem list     Assessment & Plan:   Acute Hypoxic Respiratory Failure  Viral pneumonia due to rhino virua ?Pneumonitis  ?Pulmonary Hypertension History of Asthma CT Chest + for   Viral panel + for rhinovirus and CT chest with patchy infiltrates Echo with preserved EF and severe tricuspid regurgitation and mild RV enlargement Procal 0.12 > 0.10 -Cardiology following and are diuresing, considering RHC -oxygen requirement has been very slowly down trending - sputum culture  for productive cough ordered 10/1>> Mono-nuclear with few gram + cocci in clusters and few gram negative rods.  - Follow up urine strep and legionella >> Have not been collected.  - Change antibiotics to ceftriaxone for 4 days + azithromycin for atypical coverage for 5 days - Incentive spirometer and flutter valve - OOB to Chair with PT/OT when able - CXR in am and prn  -she is not currently wheezing, continue prn duonebs.  If worsening consider steroids for severe pneumonia -Agree with  outpatient sleep study to evaluate for sleep disordered breathing - ok for CPAP/Bipap at night while in hospital but if patient not tolerating it well, ok to stay on [place] at night  PCCM will continue to follow  Best Practice (right click and "Reselect all SmartList Selections" daily)   Per primary  Labs   CBC: Recent Labs  Lab 08/06/22 2100 08/08/22 0317 08/09/22 0438 08/10/22 1001 08/11/22 0148  WBC 6.0 6.6 7.0 7.3 8.0  NEUTROABS 4.2  --   --   --   --   HGB 12.8 11.0* 11.1* 11.9* 12.2  HCT 37.5 33.1* 32.1* 34.4* 34.6*  MCV 82.2 84.0 80.7 79.8* 80.1  PLT 221 190 187 231 753    Basic Metabolic Panel: Recent Labs  Lab 08/06/22 2100 08/08/22 0317 08/09/22 0438 08/10/22 1001 08/11/22 0148  NA 139 138 136 136 133*  K 4.4 3.9 3.7 3.7 3.4*  CL 102 102 98 98 93*  CO2 _0 GLUCOSE 138* 128* 140* 147* 157*  BUN 24* _1 CREATININE 1.28* 1.11* 1.16* 1.05* 1.09*  CALCIUM 10.2 9.2 9.5 9.3 9.0  MG  --  1.9 1.7 1.9  --    GFR: Estimated Creatinine Clearance: 55.5 mL/min (A) (by C-G formula based on SCr of 1.09 mg/dL (H)). Recent Labs  Lab 08/07/22 1450 08/08/22 0317 08/09/22 0438 08/10/22 1001 08/11/22 0148  PROCALCITON  --  0.12 0.10  --   --   WBC  --  6.6 7.0 7.3 8.0  LATICACIDVEN 1.1  --   --   --   --     Liver Function Tests: Recent Labs  Lab 08/06/22 2100  AST 26  ALT 20  ALKPHOS 82  BILITOT 1.3*  PROT 9.0*  ALBUMIN 4.1   Recent Labs  Lab 08/06/22 2100  LIPASE 41   No results for input(s): "AMMONIA" in the last 168 hours.  ABG    Component Value Date/Time   HCO3 29.2 (H) 08/09/2022 1134   O2SAT 100 08/09/2022 1134     Coagulation Profile: Recent Labs  Lab 08/08/22 0317 08/09/22 0438 08/10/22 1001  INR 2.5* 2.4* 1.7*    Cardiac Enzymes: No results for input(s): "CKTOTAL", "CKMB", "CKMBINDEX", "TROPONINI" in the last 168 hours.  HbA1C: Hgb A1c MFr Bld  Date/Time Value Ref Range Status  08/08/2022 03:17 AM 6.7  (H) 4.8 - 5.6 % Final    Comment:    (NOTE) Pre diabetes:          5.7%-6.4%  Diabetes:              >6.4%  Glycemic control for   <7.0% adults with diabetes     CBG: Recent Labs  Lab 08/10/22 0822 08/10/22 1151 08/10/22 1625 08/10/22 2018 08/11/22 0753  GLUCAP 161* 149* 175* 158* 135*       APP Time 25 minutes    Magdalen Spatz, MSN, AGACNP-BC [place]  for personal pager PCCM on call pager [phone]   See Amion for personal pager PCCM on call pager [phone] until 7pm. Please call Elink 7p-7a. [phone] 08/11/2022 12:14 PM   I agree with the Advanced Practitioner's note, impression, and recommendations as outlined. I have taken an independent interval history, reviewed the chart and examined the patient.  My medical decision making is as follows:   Subjective: Still short of breath with productive cough.  Using flutter valve Sons at bedside Febrile today to 100.9   Objective: Blood pressure 115/81, pulse (!) 121, temperature 99.1 F (37.3 C), temperature source Oral, resp. rate 19, height _0  (1.575 m), weight 116.1 kg, SpO2 94 %.  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 This could be all viral mediated. Primary team broadened abx. Not unreasonable given her profound hypoxemia. Would treat for HAP at this point if that is the case.  Suspect her hypoxemia is mostly mediated by the pulmonary hypertension. Agree with diuresis and LHC/RHC per  cardiology. PCCM will continue to follow. She denies apneas, snoring to me. But sleep study as outpatient would be reasonable as well. She lives in [facility] and would like to follow up her care locally rather than here in [place].    Spero Geralds [facility] 08/11/2022 6:01 PM  Pager: see AMION  If no response to pager, please call critical care on call (see AMION) until 7pm After 7:00 pm call Elink

## 2022-08-11 NOTE — Progress Notes (Signed)
Patient placed on BiPAP at this time.

## 2022-08-11 NOTE — Progress Notes (Addendum)
Rounding Note    Patient Name: Candice Mclean Date of Encounter: 08/11/2022  [place] Cardiologist: Sanda Klein, MD   Subjective   Breathing same. Still on high flow oxygen.  No chest pain.  Inpatient Medications    Scheduled Meds:  azithromycin  250 mg Oral Daily   diclofenac Sodium  2 g Topical QID   furosemide  40 mg Intravenous Q8H   gabapentin  100 mg Oral TID   influenza vaccine adjuvanted  0.5 mL Intramuscular Tomorrow-1000   insulin aspart  0-9 Units Subcutaneous TID WC   ipratropium-albuterol  3 mL Nebulization BID   metoprolol tartrate  25 mg Oral BID   pantoprazole  40 mg Oral BID   polyethylene glycol  17 g Oral Daily   senna-docusate  1 tablet Oral Daily   spironolactone  12.5 mg Oral Daily   Continuous Infusions:  cefTRIAXone (ROCEPHIN)  IV Stopped (08/10/22 1732)   heparin 1,400 Units/hr (08/11/22 0945)   PRN Meds: acetaminophen, lip balm, phenol, sodium chloride   Vital Signs    Vitals:   08/11/22 0409 08/11/22 0756 08/11/22 0801 08/11/22 1025  BP:    130/83  Pulse: 99   91  Resp: (!) 24   20  Temp:      TempSrc:    Oral  SpO2: 96% 99% 96%   Weight:      Height:        Intake/Output Summary (Last 24 hours) at 08/11/2022 1136 Last data filed at 08/11/2022 0105 Gross per 24 hour  Intake 100 ml  Output --  Net 100 ml      08/09/2022    4:05 AM 08/07/2022    1:37 PM  Last 3 Weights  Weight (lbs) 255 lb 15.3 oz 255 lb  Weight (kg) 116.1 kg 115.667 kg      Telemetry    Atrial fibrillation at rate of 100s - Personally Reviewed  ECG    N/A  Physical Exam   GEN: No acute distress.   Neck: No JVD Cardiac: IR IR , no murmurs, rubs, or gallops.  Respiratory: Clear to auscultation bilaterally. GI: Soft, nontender, non-distended  MS: No edema; No deformity. Neuro:  Nonfocal  Psych: Normal affect   Labs    High Sensitivity Troponin:   Recent Labs  Lab 08/06/22 2100 08/07/22 0620 08/09/22 1134 08/09/22 1312   TROPONINIHS _0 Chemistry Recent Labs  Lab 08/06/22 2100 08/08/22 0317 08/09/22 0438 08/10/22 1001 08/11/22 0148  NA 139 138 136 136 133*  K 4.4 3.9 3.7 3.7 3.4*  CL 102 102 98 98 93*  CO2 _1 GLUCOSE 138* 128* 140* 147* 157*  BUN 24* _2 CREATININE 1.28* 1.11* 1.16* 1.05* 1.09*  CALCIUM 10.2 9.2 9.5 9.3 9.0  MG  --  1.9 1.7 1.9  --   PROT 9.0*  --   --   --   --   ALBUMIN 4.1  --   --   --   --   AST 26  --   --   --   --   ALT 20  --   --   --   --   ALKPHOS 82  --   --   --   --   BILITOT 1.3*  --   --   --   --   GFRNONAA 44* 52* 50* 56* 54*  ANIONGAP 13 14  _0 Lipids No results for input(s): "CHOL", "TRIG", "HDL", "LABVLDL", "LDLCALC", "CHOLHDL" in the last 168 hours.  Hematology Recent Labs  Lab 08/09/22 0438 08/10/22 1001 08/11/22 0148  WBC 7.0 7.3 8.0  RBC 3.98 4.31 4.32  HGB 11.1* 11.9* 12.2  HCT 32.1* 34.4* 34.6*  MCV 80.7 79.8* 80.1  MCH 27.9 27.6 28.2  MCHC 34.6 34.6 35.3  RDW 19.9* 19.4* 19.1*  PLT 187 231 244   Thyroid No results for input(s): "TSH", "FREET4" in the last 168 hours.  BNP Recent Labs  Lab 08/07/22 1008  BNP 136.0*    DDimer No results for input(s): "DDIMER" in the last 168 hours.   Radiology    VAS [place] UPPER EXTREMITY VENOUS DUPLEX  Result Date: 08/10/2022 UPPER VENOUS STUDY  Patient Name:  MELICIA ESQUEDA  Date of Exam:   08/10/2022 Medical Rec #: [id number]        Accession #:    [id number] Date of Birth: 1946/05/26       Patient Gender: F Patient Age:   76 years Exam Location:  [facility] Procedure:      VAS [place] UPPER EXTREMITY VENOUS DUPLEX Referring Phys: MARGARET PRAY --------------------------------------------------------------------------------  Indications: Pain Comparison Study: no prior Performing Technologist: Archie Patten RVS  Examination Guidelines: A complete evaluation includes B-mode imaging, spectral Doppler, color Doppler, and power Doppler as needed of all  accessible portions of each vessel. Bilateral testing is considered an integral part of a complete examination. Limited examinations for reoccurring indications may be performed as noted.  Right Findings: +----------+------------+---------+-----------+----------+-------+ RIGHT     CompressiblePhasicitySpontaneousPropertiesSummary +----------+------------+---------+-----------+----------+-------+ IJV           Full       Yes       Yes                      +----------+------------+---------+-----------+----------+-------+ Subclavian    Full       Yes       Yes                      +----------+------------+---------+-----------+----------+-------+ Axillary      Full       Yes       Yes                      +----------+------------+---------+-----------+----------+-------+ Brachial      Full       Yes       Yes                      +----------+------------+---------+-----------+----------+-------+ Radial        Full                                          +----------+------------+---------+-----------+----------+-------+ Ulnar         Full                                          +----------+------------+---------+-----------+----------+-------+ Cephalic      Full                                          +----------+------------+---------+-----------+----------+-------+  Basilic       Full                                          +----------+------------+---------+-----------+----------+-------+  Left Findings: +----------+------------+---------+-----------+----------+-------+ LEFT      CompressiblePhasicitySpontaneousPropertiesSummary +----------+------------+---------+-----------+----------+-------+ Subclavian               Yes       Yes                      +----------+------------+---------+-----------+----------+-------+  Summary:  Right: No evidence of deep vein thrombosis in the upper extremity. No evidence of superficial vein thrombosis  in the upper extremity.  Left: No evidence of thrombosis in the subclavian.  *See table(s) above for measurements and observations.  Diagnosing physician: Deitra Mayo MD Electronically signed by Deitra Mayo MD on 08/10/2022 at 2:02:20 PM.    Final    VAS [place] LOWER EXTREMITY VENOUS (DVT)  Result Date: 08/10/2022  Lower Venous DVT Study Patient Name:  KEYLY BALDONADO  Date of Exam:   08/10/2022 Medical Rec #: [id number]        Accession #:    [id number] Date of Birth: 19-May-1946       Patient Gender: F Patient Age:   76 years Exam [facility] Procedure:      VAS [place] LOWER EXTREMITY VENOUS (DVT) Referring Phys: MARGARET PRAY --------------------------------------------------------------------------------  Indications: Pain.  Limitations: Body habitus, poor ultrasound/tissue interface and pain tolerance. Comparison Study: no prior Performing Technologist: Archie Patten RVS  Examination Guidelines: A complete evaluation includes B-mode imaging, spectral Doppler, color Doppler, and power Doppler as needed of all accessible portions of each vessel. Bilateral testing is considered an integral part of a complete examination. Limited examinations for reoccurring indications may be performed as noted. The reflux portion of the exam is performed with the patient in reverse Trendelenburg.  +--------+---------------+---------+-----------+----------+--------------------+ RIGHT   CompressibilityPhasicitySpontaneityPropertiesThrombus Aging       +--------+---------------+---------+-----------+----------+--------------------+ CFV     Full           Yes      Yes                                       +--------+---------------+---------+-----------+----------+--------------------+ SFJ     Full                                                              +--------+---------------+---------+-----------+----------+--------------------+ FV Prox Full                                                               +--------+---------------+---------+-----------+----------+--------------------+ FV Mid                 Yes      Yes                  patent by color  doppler              +--------+---------------+---------+-----------+----------+--------------------+ FV                     Yes      Yes                  patent by color      Distal                                               doppler              +--------+---------------+---------+-----------+----------+--------------------+ PFV     Full                                                              +--------+---------------+---------+-----------+----------+--------------------+ POP                    Yes      Yes                  patent by color                                                           doppler              +--------+---------------+---------+-----------+----------+--------------------+ PTV     Full                                                              +--------+---------------+---------+-----------+----------+--------------------+ PERO                   Yes      Yes                  patent by color                                                           doppler              +--------+---------------+---------+-----------+----------+--------------------+   +--------+---------------+---------+-----------+----------+--------------------+ LEFT    CompressibilityPhasicitySpontaneityPropertiesThrombus Aging       +--------+---------------+---------+-----------+----------+--------------------+ CFV     Full           Yes      Yes                                       +--------+---------------+---------+-----------+----------+--------------------+ SFJ     Full                                                               +--------+---------------+---------+-----------+----------+--------------------+  FV Prox Full                                                              +--------+---------------+---------+-----------+----------+--------------------+ FV Mid                 Yes      Yes                  patent by color                                                           doppler              +--------+---------------+---------+-----------+----------+--------------------+ FV                     Yes      Yes                  patent by color      Distal                                               doppler              +--------+---------------+---------+-----------+----------+--------------------+ PFV     Full                                                              +--------+---------------+---------+-----------+----------+--------------------+ POP                    Yes      Yes                  patent by color                                                           doppler              +--------+---------------+---------+-----------+----------+--------------------+ PTV     Full                                                              +--------+---------------+---------+-----------+----------+--------------------+ PERO    Full                                                              +--------+---------------+---------+-----------+----------+--------------------+  Summary: RIGHT: - There is no evidence of deep vein thrombosis in the lower extremity. However, portions of this examination were limited- see technologist comments above.  - No cystic structure found in the popliteal fossa.  LEFT: - There is no evidence of deep vein thrombosis in the lower extremity. However, portions of this examination were limited- see technologist comments above.  - No cystic structure found in the popliteal fossa.  *See table(s) above for measurements and  observations. Electronically signed by Deitra Mayo MD on 08/10/2022 at 1:59:49 PM.    Final    DG CHEST PORT 1 VIEW  Result Date: 08/09/2022 CLINICAL DATA:  Short of breath. EXAM: PORTABLE CHEST 1 VIEW COMPARISON:  08/07/2022 and older exams.  CT, 08/08/2022. FINDINGS: Mild stable cardiomegaly. Stable left anterior chest wall dual lead pacemaker. Lung base opacities, left greater than right, appear improved from the prior exams. No new lung abnormalities. No convincing pleural effusion and no pneumothorax. IMPRESSION: 1. Improved lung base opacities compared to the prior exams, likely improved atelectasis. 2. No new abnormalities.  No overt pulmonary edema. Electronically Signed   By: Lajean Manes M.D.   On: 08/09/2022 13:34    Cardiac Studies   TTE 08/07/2022  1. Left ventricular ejection fraction, by estimation, is 55 to 60%. The  left ventricle has normal function. The left ventricle has no regional  wall motion abnormalities. Left ventricular diastolic parameters are  indeterminate.   2. Right ventricular systolic function is normal. The right ventricular  size is mildly enlarged. There is severely elevated pulmonary artery  systolic pressure. The estimated right ventricular systolic pressure is  38.2 mmHg.   3. Left atrial size was moderately dilated.   4. Right atrial size was moderately dilated.   5. The mitral valve is grossly normal. No evidence of mitral valve  regurgitation.   6. Appears RV lead is impacting coaptation of the tricuspid valve  leaflets. Tricuspid valve regurgitation is severe.   7. The aortic valve is tricuspid. Aortic valve regurgitation is not  visualized.   8. The inferior vena cava is dilated in size with <50% respiratory  variability, suggesting right atrial pressure of 15 mmHg.   Patient Profile     76 y.o. female  with hx of atrial fibrillation status post pacemaker, morbid obesity, asthma who was admitted on 08/07/2022 for dysphagia and vomiting.   Cardiology was consulted for echocardiogram which shows severe pulmonary hypertension and RV dysfunction.  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 -She presents with acute onset of dysphagia and nausea.  She also was found to have hypoxic respiratory failure.  Her echocardiogram is interesting in that she has severe pulm hypertension.  No acute PE.  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  Possible pneumonia as well. -There was evidence of  right heart failure on my examination.   - Her oxygen requirement felt quite significant compared to what was seen on her CT. - -She could have undiagnosed sleep apnea and lung dz - Appreciate PCCM assistant - Now on Lasix 52m TID. Net I & O -3L with 1L in past 24 hours. No daily weight.  - Continue lasix at current dose.  - Will supplement K - Continue Spiro 12.564mqd - [facility]    # Permanent Atrial fibrillation -Hold Coumadin.  Transitioned heparin as INR  is below 2.   -Rate stable in 100s. Continue metoprolol  24m BID  # Hypokalemia - Will supplement with diuresis    For questions or updates, please contact CFanning SpringsPlease consult [email or web address] for contact info under        Signed,Leanor Kail PA  08/11/2022, 11:36 AM      Attending Note:   The patient was seen and examined.  Agree with assessment and plan as noted above.  Changes made to the above note as needed.  Patient seen and independently examined with VRobbie Lis PA .   We discussed all aspects of the encounter. I agree with the assessment and plan as stated above.    Pulmonary HTN:    will schedule R and L heart cath for tomorrow  I have discussed the risks / benefits, options.  She understands and agrees to proceed.  Uses BIPAP at home    2.  Atrial fib : on heparin .   [place] Riverfor  cath tomorrow    I have spent a total of 40 minutes with patient reviewing hospital  notes , telemetry, EKGs, labs and examining patient as well as establishing an assessment and plan that was discussed with the patient.  > 50% of time was spent in direct patient care.   3.  Obesity :    4.   PRamond Dial, MD, FCentral Coast Endoscopy Center Inc10/12/2021, 1:02 PM [address]  SEdgemerePager [phone]

## 2022-08-11 NOTE — Progress Notes (Signed)
ANTICOAGULATION CONSULT NOTE - Follow Up Consult  Pharmacy Consult for heparin Indication: atrial fibrillation  Labs: Recent Labs    08/09/22 0438 08/09/22 1134 08/09/22 1312 08/10/22 1001 08/10/22 1834 08/11/22 0148  HGB 11.1*  --   --  11.9*  --  12.2  HCT 32.1*  --   --  34.4*  --  34.6*  PLT 187  --   --  231  --  244  LABPROT 25.8*  --   --  20.0*  --   --   INR 2.4*  --   --  1.7*  --   --   HEPARINUNFRC  --   --   --   --  0.16* 0.27*  CREATININE 1.16*  --   --  1.05*  --  1.09*  TROPONINIHS  --  14 13  --   --   --     Assessment: 75yo female subtherapeutic on heparin after rate change; no infusion issues or signs of bleeding per RN.  Goal of Therapy:  Heparin level 0.3-0.7 units/ml   Plan:  Will increase heparin infusion by 1-2 units/kg/hr to 1400 units/hr and check level in 6-8 hours.    Wynona Neat, PharmD, BCPS  08/11/2022,4:06 AM

## 2022-08-11 NOTE — Progress Notes (Signed)
   08/11/22 1233  Assess: MEWS Score  Temp (!) 100.9 F (38.3 C)  BP 120/83  MAP (mmHg) 94  Pulse Rate (!) 111  ECG Heart Rate (!) 114  Resp 19  SpO2 92 %  Assess: MEWS Score  MEWS Temp 1  MEWS Systolic 0  MEWS Pulse 2  MEWS RR 0  MEWS LOC 0  MEWS Score 3  MEWS Score Color Yellow  Assess: if the MEWS score is Yellow or Red  Were vital signs taken at a resting state? Yes  Focused Assessment Change from prior assessment (see assessment flowsheet)  Does the patient meet 2 or more of the SIRS criteria? Yes  Does the patient have a confirmed or suspected source of infection? Yes  Provider and Rapid Response Notified? Yes  MEWS guidelines implemented *See Row Information* Yes  Notify: Charge Nurse/RN  Name of Charge Nurse/RN Notified erin  Date Charge Nurse/RN Notified 08/11/22  Time Charge Nurse/RN Notified 1300  Document  Patient Outcome Stabilized after interventions  Assess: SIRS CRITERIA  SIRS Temperature  0  SIRS Pulse 1  SIRS Respirations  0  SIRS WBC 1  SIRS Score Sum  2

## 2022-08-11 NOTE — Progress Notes (Signed)
PT Cancellation Note  Patient Details Name: Candice Mclean MRN: [id number] DOB: 27-Jan-1946   Cancelled Treatment:    Reason Eval/Treat Not Completed: Medical issues which prohibited therapy (pt with persistent fever and tachy in afternoon on 20L [place] at rest, defer PT session until following date for pt safety given unstable vitals.) Will continue efforts next date per PT plan of care as schedule permits.   Kara Pacer Uri Turnbough 08/11/2022, 6:31 PM* delayed entry

## 2022-08-11 NOTE — Progress Notes (Addendum)
ANTICOAGULATION CONSULT NOTE  Pharmacy Consult for heparin  Indication: atrial fibrillation  Allergies  Allergen Reactions   Versed [Midazolam] Hives   Tylenol With Codeine #3 [Acetaminophen-Codeine] Palpitations    Patient Measurements: Height: _0  (157.5 cm) Weight: 116.1 kg (255 lb 15.3 oz) IBW/kg (Calculated) : 50.1 Heparin Dosing Weight: 78.7kg   Vital Signs: Temp: 100.9 F (38.3 C) (10/02 1233) Temp Source: Oral (10/02 1233) BP: 120/83 (10/02 1233) Pulse Rate: 111 (10/02 1233)  Labs: Recent Labs    08/09/22 0438 08/09/22 1134 08/09/22 1312 08/10/22 1001 08/10/22 1834 08/11/22 0148 08/11/22 1042  HGB 11.1*  --   --  11.9*  --  12.2  --   HCT 32.1*  --   --  34.4*  --  34.6*  --   PLT 187  --   --  231  --  244  --   LABPROT 25.8*  --   --  20.0*  --   --  20.1*  INR 2.4*  --   --  1.7*  --   --  1.7*  HEPARINUNFRC  --   --   --   --  0.16* 0.27* 0.39  CREATININE 1.16*  --   --  1.05*  --  1.09*  --   TROPONINIHS  --  14 13  --   --   --   --     Estimated Creatinine Clearance: 55.5 mL/min (A) (by C-G formula based on SCr of 1.09 mg/dL (H)).   Medical History: Past Medical History:  Diagnosis Date   AF (atrial fibrillation) (HCC)    CHF (congestive heart failure) (HCC)    Coronary artery disease    Diabetes mellitus without complication ([place])    Hypertension    Medtronic pacemaker 2015    Medications:  Scheduled:   azithromycin  250 mg Oral Daily   diclofenac Sodium  2 g Topical QID   furosemide  40 mg Intravenous Q8H   gabapentin  100 mg Oral TID   influenza vaccine adjuvanted  0.5 mL Intramuscular Tomorrow-1000   insulin aspart  0-9 Units Subcutaneous TID WC   ipratropium-albuterol  3 mL Nebulization BID   metoprolol tartrate  25 mg Oral BID   pantoprazole  40 mg Oral BID   polyethylene glycol  17 g Oral Daily   potassium chloride  40 mEq Oral BID   senna-docusate  1 tablet Oral Daily   spironolactone  12.5 mg Oral Daily   Infusions:    cefTRIAXone (ROCEPHIN)  IV Stopped (08/10/22 1732)   heparin 1,400 Units/hr (08/11/22 0945)    Assessment: 76 YO female presenting 9/28 with sudden onset dysphagia, associated vomiting, and subsequently developed SOB. Patient has a history of afib with PTA warfarin regimen of 7.5 mg on Sun/Mon/Thurs and 5 mg on all other days--no doses since admission. Patient's INR was 2.5 on admission and heparin was started 10/1 after INR < 2.   Heparin level today is therapeutic at 0.39, on 1400 units/hr. Hgb 12.2, plt 244--stable. No line issues or signs/symptoms of bleeding reported.   Goal of Therapy:  Heparin level 0.3-0.7 units/ml Monitor platelets by anticoagulation protocol: Yes   Plan:  Continue heparin 1400 units/hr Re-check heparin level in 6hrs for confirmatory level Monitor heparin level, CBC, and s/sx of bleeding daily    Billey Gosling, PharmD PGY1 Pharmacy Resident 10/2/202312:59 PM

## 2022-08-11 NOTE — Consult Note (Addendum)
Advanced Heart Failure Team Consult Note   Primary Physician: Janene Madeira, Vernice Jefferson, MD PCP-Cardiologist:  Sanda Klein, MD  Reason for Consultation: New pulmonary HTN   HPI:    Brynja Marker is seen today for evaluation of pulmonary hypertension at the request of Dr. Acie Fredrickson with cardiology.  Ms. Huebert is a 76 y.o AAF with PMH of morbid obesity, HTN, DM, Afib on warfarin, syncope and pauses s/p MDT PPM 14', HLD, HFpEF, and PE 17'. AHF team asked to see for New pulmonary HTN.   Patient follows cardiologist at [facility], records not fully available. Last Echo was 66' and EF was 50%. 5/13 cath at [facility] revealed normal coronary arteries and normal LV function   Pt originally presented with sudden onset dysphagia, N/V and SOB. Couldn't keep anything down. SOB started while in ED. Required new O2, no previous O2 use at home. Had been febrile at home, +cough started 9/24. Had CP and back pain, typical for her when in a fib. In the ED EKG a fib 80 bpm, BNP 136, Cr. 1.28, K 4.4. Diuresed with IV lasix. CT imaging showing atelectasis vs pneumonia started on empiric abx. Started on sepsis protocol and treated w/ Zofran.   Echo 9/28 during admission revealed EF 55-60%, RV mildly enlarged, LA/RA mod dilated, severely elevated PASP est 68.6, no MR, severe TR. New finding of pulmonary HTN, AHF team asked to see for further management.  Today still SOB, on 20L HFNC, wears BiPAP at night. + cough. On zithromax and ceftriaxone. Denies CP. Febrile.   Echo: 9/28: EF 55-60%. RV mildly enlarged, LA/RA mod dilated, severely elevated PASP est 68.6, no MR, severe TR 10/18: EF 50% 4/14 contrast bubble study using agitated saline showed no evidence of intracardiac shunt 12/13 EF 66% right-sided chamber enlargement. Exercise stress test:  5/13 inferoapical ischemia anterior wall ischemia increased gut uptake Cath: 5/13 catheterization at [facility] revealed normal coronary arteries and normal LV  function  Review of Systems: [y] = yes, _0  = no   General: Weight gain [Y ]; Weight loss _1 ; Anorexia _2 ; Fatigue [ Y]; Fever _3 ; Chills _4 ; Weakness [Y ]  Cardiac: Chest pain/pressure _5 ; Resting SOB [Y ]; Exertional SOB [Y ]; Orthopnea _6 ; Pedal Edema _7 ; Palpitations _8 ; Syncope _9 ; Presyncope _10 ; Paroxysmal nocturnal dyspnea_11   Pulmonary: Cough Jazmín.Cullens ]; Wheezing_12 ; Hemoptysis_13 ; Sputum _14 ; Snoring _15   GI: Vomiting[ Y]; Dysphagia_16 ; Melena_17 ; Hematochezia _18 ; Heartburn_19 ; Abdominal pain _20 ; Constipation _21 ; Diarrhea _22 ; BRBPR _23   GU: Hematuria_24 ; Dysuria _25 ; Nocturia_26   Vascular: Pain in legs with walking _27 ; Pain in feet with lying flat _28 ; Non-healing sores _29 ; Stroke _30 ; TIA _31 ; Slurred speech _32 ;  Neuro: Headaches_33 ; Vertigo_34 ; Seizures_35 ; Paresthesias_36 ;Blurred vision _37 ; Diplopia _38 ; Vision changes _39   Ortho/Skin: Arthritis _40 ; Joint pain _41 ; Muscle pain _42 ; Joint swelling _43 ; Back Pain _44 ; Rash _45   Psych: Depression_46 ; Anxiety_47   Heme: Bleeding problems _48 ; Clotting disorders _49 ; Anemia _50   Endocrine: Diabetes [Y ]; Thyroid dysfunction_51   Home Medications Prior to Admission medications   Medication Sig Start Date End Date Taking? Authorizing Provider  acetaminophen (TYLENOL) 500 MG tablet Take 1 tablet (500 mg total) by mouth every 6 (six) hours as needed. 08/07/22  Yes  Blanchie Dessert, MD  albuterol (PROVENTIL HFA;VENTOLIN HFA) 108 (90 Base) MCG/ACT inhaler Inhale 2 puffs into the lungs every 2 (two) hours as needed for wheezing or shortness of breath. 01/10/17  Yes Elwin Mocha, MD  amLODipine (NORVASC) 10 MG tablet Take 10 mg by mouth daily. 07/12/22  Yes [provider]  aspirin EC 81 MG tablet Take 81 mg by mouth daily.   Yes [provider]  atorvastatin (LIPITOR) 80 MG tablet Take 80 mg by mouth daily.   Yes [provider]  furosemide (LASIX) 20 MG tablet Take 20 mg by mouth daily.   Yes [provider]  JARDIANCE 25 MG TABS tablet Take 25 mg by mouth daily. 05/28/22  Yes [provider]  latanoprost (XALATAN) 0.005 % ophthalmic solution Place 1 drop into both eyes at bedtime.   Yes [provider]  lisinopril (ZESTRIL) 40 MG tablet Take 40 mg by mouth daily. 05/28/22  Yes [provider]  magnesium oxide (MAG-OX) 400 (241.3 Mg) MG tablet Take 400-800 mg by mouth 2 (two) times daily. Two tablets in the morning and 1 tablet in the evening   Yes [provider]  metFORMIN (GLUCOPHAGE) 500 MG tablet Take 1,000 mg by mouth daily with breakfast.   Yes [provider]  metoprolol tartrate (LOPRESSOR) 25 MG tablet Take 25 mg by mouth 2 (two) times daily.   Yes [provider]  potassium chloride SA (KLOR-CON M) 20 MEQ tablet Take 20 mEq by mouth daily. 07/12/22  Yes [provider]  warfarin (COUMADIN) 5 MG tablet Take 5 mg by mouth See admin instructions. Take 5 mg by mouth daily except take 7.5 mg on Sunday, Monday, and Thursday.   Yes [provider]    Past Medical History: Past Medical History:  Diagnosis Date   AF (atrial fibrillation) (HCC)    CHF (congestive heart failure) ([place])    Coronary artery disease    Diabetes mellitus without complication ([place])    Hypertension    Medtronic pacemaker 2015    Past Surgical History: Past Surgical History:  Procedure Laterality Date   CARDIAC CATHETERIZATION  2013   No significant CAD    Family History: Family History  Problem Relation Age of Onset   Heart disease Mother    Stroke Father     Social History: Social History   Socioeconomic History   Marital status: Single    Spouse name: Not on file   Number of children: Not on file   Years of education: Not on file   Highest education level: Not on file  Occupational History   Not on file  Tobacco Use   Smoking status: Never   Smokeless tobacco: Never  Vaping Use   Vaping Use: Never used   Substance and Sexual Activity   Alcohol use: No   Drug use: Never   Sexual activity: Not Currently  Other Topics Concern   Not on file  Social History Narrative   Not on file   Social Determinants of Health   Financial Resource Strain: Not on file  Food Insecurity: No Food Insecurity (08/08/2022)   Hunger Vital Sign    Worried About Running Out of Food in the Last Year: Never true    Ran Out of Food in the Last Year: Never true  Transportation Needs: No Transportation Needs (08/08/2022)   PRAPARE - Hydrologist (Medical): No    Lack of Transportation (Non-Medical): No  Physical Activity: Not on file  Stress: Not on file  Social Connections: Not on file    Allergies:  Allergies  Allergen Reactions   Versed [Midazolam] Hives   Tylenol With Codeine #3 [Acetaminophen-Codeine] Palpitations    Objective:    Vital Signs:   Temp:  [99.2 F (37.3 C)-100.9 F (38.3 C)] 100.9 F (38.3 C) (10/02 1233) Pulse Rate:  [81-111] 111 (10/02 1233) Resp:  [18-28] 18 (10/02 1320) BP: (120-138)/(83-86) 120/83 (10/02 1233) SpO2:  [92 %-99 %] 95 % (10/02 1320) FiO2 (%):  [40 %-50 %] 40 % (10/02 1320) Weight:  [116.1 kg] 116.1 kg (10/02 1233) Last BM Date : 08/07/22  Weight change: Filed Weights   08/07/22 1337 08/09/22 0405 08/11/22 1233  Weight: 115.7 kg 116.1 kg 116.1 kg    Intake/Output:   Intake/Output Summary (Last 24 hours) at 08/11/2022 1343 Last data filed at 08/11/2022 0105 Gross per 24 hour  Intake 100 ml  Output --  Net 100 ml     Physical Exam    General:  obese appearing.  No respiratory difficulty HEENT: normal Neck: supple. JVD ~14 w/ V waves. Carotids 2+ bilat; no bruits. No lymphadenopathy or thyromegaly appreciated. Cor: PMI nondisplaced. Irregular rate & rhythm. No rubs, gallops or + TR murmur. Lungs: diminished throughout, poor resp effort Abdomen: soft, nontender, nondistended. No hepatosplenomegaly. No bruits or masses. Good  bowel sounds. Extremities: no cyanosis, clubbing, rash, + BLE edema  Neuro: alert & oriented x 3, cranial nerves grossly intact. moves all 4 extremities w/o difficulty. Affect pleasant.  Telemetry   A fib low 100s (Personally reviewed)    EKG    A fib 92 bpm (Personally reviewed)    Labs   Basic Metabolic Panel: Recent Labs  Lab 08/06/22 2100 08/08/22 0317 08/09/22 0438 08/10/22 1001 08/11/22 0148  NA 139 138 136 136 133*  K 4.4 3.9 3.7 3.7 3.4*  CL 102 102 98 98 93*  CO2 _0 GLUCOSE 138* 128* 140* 147* 157*  BUN 24* _1 CREATININE 1.28* 1.11* 1.16* 1.05* 1.09*  CALCIUM 10.2 9.2 9.5 9.3 9.0  MG  --  1.9 1.7 1.9  --     Liver Function Tests: Recent Labs  Lab 08/06/22 2100  AST 26  ALT 20  ALKPHOS 82  BILITOT 1.3*  PROT 9.0*  ALBUMIN 4.1   Recent Labs  Lab 08/06/22 2100  LIPASE 41   No results for input(s): "AMMONIA" in the last 168 hours.  CBC: Recent Labs  Lab 08/06/22 2100 08/08/22 0317 08/09/22 0438 08/10/22 1001 08/11/22 0148  WBC 6.0 6.6 7.0 7.3 8.0  NEUTROABS 4.2  --   --   --   --   HGB 12.8 11.0* 11.1* 11.9* 12.2  HCT 37.5 33.1* 32.1* 34.4* 34.6*  MCV 82.2 84.0 80.7 79.8* 80.1  PLT 221 190 187 231 244    Cardiac Enzymes: No results for input(s): "CKTOTAL", "CKMB", "CKMBINDEX", "TROPONINI" in the last 168 hours.  BNP: BNP (last 3 results) Recent Labs    08/07/22 1008  BNP 136.0*    ProBNP (last 3 results) No results for input(s): "PROBNP" in the last 8760 hours.   CBG: Recent Labs  Lab 08/10/22 1151 08/10/22 1625 08/10/22 2018 08/11/22 0753 08/11/22 1235  GLUCAP 149* 175* 158* 135* 223*    Coagulation Studies: Recent Labs    08/09/22 0438 08/10/22 1001 08/11/22 1042  LABPROT 25.8* 20.0* 20.1*  INR 2.4* 1.7* 1.7*  Imaging   No results found.   Medications:     Current Medications:  azithromycin  250 mg Oral Daily   diclofenac Sodium  2 g Topical QID   furosemide  40 mg  Intravenous Q8H   gabapentin  100 mg Oral TID   influenza vaccine adjuvanted  0.5 mL Intramuscular Tomorrow-1000   insulin aspart  0-9 Units Subcutaneous TID WC   ipratropium-albuterol  3 mL Nebulization BID   metoprolol tartrate  25 mg Oral BID   pantoprazole  40 mg Oral BID   polyethylene glycol  17 g Oral Daily   potassium chloride  40 mEq Oral BID   senna-docusate  1 tablet Oral Daily   spironolactone  12.5 mg Oral Daily    Infusions:  cefTRIAXone (ROCEPHIN)  IV Stopped (08/10/22 1732)   heparin 1,400 Units/hr (08/11/22 0945)      Patient Profile   Ms. Candice Mclean is a 76 y.o AAF with PMH of HTN, DM, Afib on warfarin, syncope and pauses s/p MDT PPM, HLD, HFpEF, and PE 17'. AHF team asked to see for New pulmonary HTN.   Assessment/Plan  New Pulmonary Hypertension - ECHO on admission revealed severe pulm HTN, RV mildly enlarged, PASP est 68.6, severe TR - RHC tomorrow to access hemodynamics and R sided pressures. 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 RV mildly enlarged, LA/RA mod dilated, severely elevated PASP est 68.6, no MR, severe TR - Presented NYHA IV on admission - admission CXR showed: New infiltrates in the right lower lung favored to represent edema, Cardiomegaly and pulmonary vascular congestion, now improved - CT chest (-) PE, Morphologic changes in keeping with PAH and at least some degree of right heart failure - Continue lasix 40 mg TID - Continue spiro 12.5 daily - Strict I&O, daily weights  Acute hypoxemic respiratory failure - Originally presented w. hypoxic resp failure, currently on 20L HFNC - Suspect obesity hypoventilation syndrome and OSA, will need OP sleep study - Cont abx and nebs, sputum cultures pending - resp panel + for rhinovirus - Now on BiPAP as  tolerated, will ask CM to f/u on home trilogy - PCCM following  HTN - stable, continue current regimen  A fib, chronic - Per cardiology, Severe TR 2/2 PM lead with PAH and PASP 68.  - Rate controlled on exam  - On Warfarin at home - Continue heparin gtt 2/2 not being able to keep medications down. INR goal 2 - Continue metoprolol 67m BID - needs OP sleep study - [facility]  HLD - needs lipid panel, may need statin  7. Hypokalemia - Being replaced by primary, 3.4 last  8. N/V - now resolved, GI saw IP  9. DM2 - Hgb A1c 6.7 - per primary   Length of Stay: [place] AGACNP-BC  08/11/2022, 1:43 PM  Advanced Heart Failure Team Pager [phone](M-F; 7a - 5p)  Please contact [facility] for night-coverage after hours (4p -7a ) and weekends on [email or web address]  Patient seen and examined with the above-signed Advanced Practice Provider and/or Housestaff. I personally reviewed laboratory data, imaging studies and relevant notes. I independently examined the patient and formulated the important aspects of the plan. I have edited the note to reflect any of my changes or salient  points. I have personally discussed the plan with the patient and/or family.  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 Echo showed LVEF 60-65% RV moderately dilated with moderate HK. RVSP ~70. Severe TR  CT chest no PE. Severe atelectasis/collapse of bilateral lower lobes  Denies h/o known HF or PAH. No h/o CTD. Had had remote PE. Denies snoring.   At baseline has lives alone x 30 years. Relatively functional/independent. No syncope/presyncope.   Since admission has been noted to have severe nocturnal hypoxia with sats in 60%  General:  Elderly weak appearing. No resp difficulty HEENT: normal Neck: supple. JVP to ear with prominent v-waves Carotids 2+ bilat; no bruits. No lymphadenopathy or thryomegaly  appreciated. Cor: PMI nondisplaced. Irregular tachy 2/6 TR Lungs: clear decreased in bases  Abdomen: obese soft, nontender, nondistended. No hepatosplenomegaly. No bruits or masses. Good bowel sounds. Extremities: no cyanosis, clubbing, rash, edema Neuro: alert & orientedx3, cranial nerves grossly intact. moves all 4 extremities w/o difficulty. 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 Will plan RHC tomorrow to further evaluate. Will need home Bipap. Check CTD serologies. Will need better rate control of AF. Suspect severe TR related to [facility] but possibility of TV leaflet entrapment by pacing wire has been raised. Not candidate for TEE currently to further evaluate. Continue diuresis.   [name], MD  4:04 PM

## 2022-08-11 NOTE — Progress Notes (Signed)
Pharmacy Antibiotic Note  Candice Mclean is a 76 y.o. female admitted on 08/06/2022 with acute respiratory failure with concern for pneumonia.  Pharmacy has been consulted for cefepime dosing.  PMH: afib (pacemaker), CHF.  Pt with recurrent fever today. Change ceftriaxone back to cefepime. D5 total abx. Wbc remains wnl  Scr  ~1, crcl >60 TBW Plan: Cefepime 2g IV q8  Height: _0  (157.5 cm) Weight: 116.1 kg (255 lb 15.3 oz) IBW/kg (Calculated) : 50.1  Temp (24hrs), Avg:100.4 F (38 C), Min:99.2 F (37.3 C), Max:101 F (38.3 C)  Recent Labs  Lab 08/06/22 2100 08/07/22 1450 08/08/22 0317 08/09/22 0438 08/10/22 1001 08/11/22 0148  WBC 6.0  --  6.6 7.0 7.3 8.0  CREATININE 1.28*  --  1.11* 1.16* 1.05* 1.09*  LATICACIDVEN  --  1.1  --   --   --   --      Estimated Creatinine Clearance: 55.5 mL/min (A) (by C-G formula based on SCr of 1.09 mg/dL (H)).    Allergies  Allergen Reactions   Versed [Midazolam] Hives   Tylenol With Codeine #3 [Acetaminophen-Codeine] Palpitations    Antimicrobials this admission: Vanc 9/28 x 1  Cefepime 9/28 >>10/1>>resume 10/2 Ceftriaxone 10/1 x1 Metronidazole 9/28 >> 9/28 Azithromycin 10/1 >> (10/6)  Microbiology results: 9/28 BCx: ngtd  9/28 UCx: suggested recollection  10/1 Sputum: pending 10/2 blood>> 10/2 urine>>  Covid test: negative 9/29: MRSA nasal: negative   Onnie Boer, PharmD, BCIDP, AAHIVP, CPP Infectious Disease Pharmacist 08/11/2022 4:21 PM

## 2022-08-11 NOTE — Progress Notes (Signed)
FMTS Interim Progress Note  S: Patient developed fever of 100.9 at 12 PM, repeat temperature of 101 at 3 PM.  I evaluated the patient at bedside.  Patient endorses no change in symptoms from this morning. Still endorsing leg pain.  O: BP 120/83 (BP Location: Right Arm)   Pulse (!) 111   Temp (!) 101 F (38.3 C) (Axillary)   Resp 18   Ht _0  (1.575 m)   Wt 116.1 kg   SpO2 95%   BMI 46.81 kg/m   General: Not in acute distress, stable Respiratory: Wheezing. Blood-tinged sputum at bedside.  20 L high flow nasal cannula, satting at 95%.  A/P: Fever Fever returned despite IV Ceftriaxone and azithromycin PO.  Currently being treated for suspected pneumonia in setting of PAH/CHF. - Switch ceftriaxone to cefepime - Repeat urine and blood culture - Reached out to pulmonology, appreciate recs  [name], DO 08/11/2022, 3:39 PM PGY-1, [place] [facility] pager [phone]

## 2022-08-12 ENCOUNTER — Inpatient Hospital Stay (HOSPITAL_COMMUNITY): Payer: Medicare Other

## 2022-08-12 ENCOUNTER — Encounter (HOSPITAL_COMMUNITY): Payer: Self-pay | Admitting: Internal Medicine

## 2022-08-12 ENCOUNTER — Encounter (HOSPITAL_COMMUNITY)
Admission: EM | Disposition: A | Discharge status: Skilled Nursing Facility | Payer: Self-pay | Source: Home / Self Care | Attending: Family Medicine

## 2022-08-12 DIAGNOSIS — I272 Pulmonary hypertension, unspecified: Secondary | ICD-10-CM

## 2022-08-12 DIAGNOSIS — I2721 Secondary pulmonary arterial hypertension: Secondary | ICD-10-CM | POA: Diagnosis not present

## 2022-08-12 DIAGNOSIS — I482 Chronic atrial fibrillation, unspecified: Secondary | ICD-10-CM

## 2022-08-12 DIAGNOSIS — J9601 Acute respiratory failure with hypoxia: Secondary | ICD-10-CM | POA: Diagnosis not present

## 2022-08-12 DIAGNOSIS — R1110 Vomiting, unspecified: Secondary | ICD-10-CM | POA: Diagnosis not present

## 2022-08-12 HISTORY — PX: RIGHT HEART CATH: CATH118263

## 2022-08-12 LAB — POCT I-STAT EG7
Acid-Base Excess: 5 mmol/L — ABNORMAL HIGH (ref 0.0–2.0)
Acid-Base Excess: 6 mmol/L — ABNORMAL HIGH (ref 0.0–2.0)
Acid-Base Excess: 6 mmol/L — ABNORMAL HIGH (ref 0.0–2.0)
Acid-Base Excess: 6 mmol/L — ABNORMAL HIGH (ref 0.0–2.0)
Acid-Base Excess: 7 mmol/L — ABNORMAL HIGH (ref 0.0–2.0)
Acid-Base Excess: 5 mmol/L — ABNORMAL HIGH (ref 0.0–2.0)
Acid-Base Excess: 6 mmol/L — ABNORMAL HIGH (ref 0.0–2.0)
Bicarbonate: 29.8 mmol/L — ABNORMAL HIGH (ref 20.0–28.0)
Bicarbonate: 30.9 mmol/L — ABNORMAL HIGH (ref 20.0–28.0)
Bicarbonate: 31.5 mmol/L — ABNORMAL HIGH (ref 20.0–28.0)
Bicarbonate: 32.1 mmol/L — ABNORMAL HIGH (ref 20.0–28.0)
Bicarbonate: 33.6 mmol/L — ABNORMAL HIGH (ref 20.0–28.0)
Bicarbonate: 29.4 mmol/L — ABNORMAL HIGH (ref 20.0–28.0)
Bicarbonate: 30.6 mmol/L — ABNORMAL HIGH (ref 20.0–28.0)
Calcium, Ion: 1.22 mmol/L (ref 1.15–1.40)
Calcium, Ion: 1.23 mmol/L (ref 1.15–1.40)
Calcium, Ion: 1.24 mmol/L (ref 1.15–1.40)
Calcium, Ion: 1.24 mmol/L (ref 1.15–1.40)
Calcium, Ion: 1.24 mmol/L (ref 1.15–1.40)
Calcium, Ion: 1.24 mmol/L (ref 1.15–1.40)
Calcium, Ion: 1.26 mmol/L (ref 1.15–1.40)
HCT: 38 % (ref 36.0–46.0)
HCT: 38 % (ref 36.0–46.0)
HCT: 38 % (ref 36.0–46.0)
HCT: 38 % (ref 36.0–46.0)
HCT: 38 % (ref 36.0–46.0)
HCT: 37 % (ref 36.0–46.0)
HCT: 39 % (ref 36.0–46.0)
Hemoglobin: 12.9 g/dL (ref 12.0–15.0)
Hemoglobin: 12.9 g/dL (ref 12.0–15.0)
Hemoglobin: 12.9 g/dL (ref 12.0–15.0)
Hemoglobin: 12.9 g/dL (ref 12.0–15.0)
Hemoglobin: 12.9 g/dL (ref 12.0–15.0)
Hemoglobin: 12.6 g/dL (ref 12.0–15.0)
Hemoglobin: 13.3 g/dL (ref 12.0–15.0)
O2 Saturation: 61 %
O2 Saturation: 61 %
O2 Saturation: 69 %
O2 Saturation: 69 %
O2 Saturation: 70 %
O2 Saturation: 60 %
O2 Saturation: 70 %
Potassium: 4.2 mmol/L (ref 3.5–5.1)
Potassium: 4.3 mmol/L (ref 3.5–5.1)
Potassium: 4.3 mmol/L (ref 3.5–5.1)
Potassium: 4.3 mmol/L (ref 3.5–5.1)
Potassium: 4.4 mmol/L (ref 3.5–5.1)
Potassium: 4.3 mmol/L (ref 3.5–5.1)
Potassium: 4.3 mmol/L (ref 3.5–5.1)
Sodium: 135 mmol/L (ref 135–145)
Sodium: 136 mmol/L (ref 135–145)
Sodium: 136 mmol/L (ref 135–145)
Sodium: 136 mmol/L (ref 135–145)
Sodium: 136 mmol/L (ref 135–145)
Sodium: 136 mmol/L (ref 135–145)
Sodium: 136 mmol/L (ref 135–145)
TCO2: 31 mmol/L (ref 22–32)
TCO2: 32 mmol/L (ref 22–32)
TCO2: 33 mmol/L — ABNORMAL HIGH (ref 22–32)
TCO2: 34 mmol/L — ABNORMAL HIGH (ref 22–32)
TCO2: 35 mmol/L — ABNORMAL HIGH (ref 22–32)
TCO2: 31 mmol/L (ref 22–32)
TCO2: 32 mmol/L (ref 22–32)
pCO2, Ven: 44.5 mmHg (ref 44–60)
pCO2, Ven: 45.4 mmHg (ref 44–60)
pCO2, Ven: 47.6 mmHg (ref 44–60)
pCO2, Ven: 49.8 mmHg (ref 44–60)
pCO2, Ven: 53 mmHg (ref 44–60)
pCO2, Ven: 42.3 mmHg — ABNORMAL LOW (ref 44–60)
pCO2, Ven: 45.1 mmHg (ref 44–60)
pH, Ven: 7.411 (ref 7.25–7.43)
pH, Ven: 7.417 (ref 7.25–7.43)
pH, Ven: 7.426 (ref 7.25–7.43)
pH, Ven: 7.429 (ref 7.25–7.43)
pH, Ven: 7.45 — ABNORMAL HIGH (ref 7.25–7.43)
pH, Ven: 7.439 — ABNORMAL HIGH (ref 7.25–7.43)
pH, Ven: 7.45 — ABNORMAL HIGH (ref 7.25–7.43)
pO2, Ven: 32 mmHg (ref 32–45)
pO2, Ven: 32 mmHg (ref 32–45)
pO2, Ven: 35 mmHg (ref 32–45)
pO2, Ven: 36 mmHg (ref 32–45)
pO2, Ven: 36 mmHg (ref 32–45)
pO2, Ven: 30 mmHg — CL (ref 32–45)
pO2, Ven: 36 mmHg (ref 32–45)

## 2022-08-12 LAB — CBC
HCT: 33.5 % — ABNORMAL LOW (ref 36.0–46.0)
Hemoglobin: 11.7 g/dL — ABNORMAL LOW (ref 12.0–15.0)
MCH: 27.5 pg (ref 26.0–34.0)
MCHC: 34.9 g/dL (ref 30.0–36.0)
MCV: 78.8 fL — ABNORMAL LOW (ref 80.0–100.0)
Platelets: 279 10*3/uL (ref 150–400)
RBC: 4.25 MIL/uL (ref 3.87–5.11)
RDW: 19.3 % — ABNORMAL HIGH (ref 11.5–15.5)
WBC: 8.7 10*3/uL (ref 4.0–10.5)
nRBC: 0 % (ref 0.0–0.2)

## 2022-08-12 LAB — CULTURE, BLOOD (SINGLE)
Culture: NO GROWTH
Culture: NO GROWTH
Special Requests: ADEQUATE

## 2022-08-12 LAB — BASIC METABOLIC PANEL
Anion gap: 10 (ref 5–15)
BUN: 22 mg/dL (ref 8–23)
CO2: 27 mmol/L (ref 22–32)
Calcium: 9.2 mg/dL (ref 8.9–10.3)
Chloride: 97 mmol/L — ABNORMAL LOW (ref 98–111)
Creatinine, Ser: 1.1 mg/dL — ABNORMAL HIGH (ref 0.44–1.00)
GFR, Estimated: 53 mL/min — ABNORMAL LOW (ref >60)
Glucose, Bld: 161 mg/dL — ABNORMAL HIGH (ref 70–99)
Potassium: 4.4 mmol/L (ref 3.5–5.1)
Sodium: 134 mmol/L — ABNORMAL LOW (ref 135–145)

## 2022-08-12 LAB — GLUCOSE, CAPILLARY
Glucose-Capillary: 163 mg/dL — ABNORMAL HIGH (ref 70–99)
Glucose-Capillary: 131 mg/dL — ABNORMAL HIGH (ref 70–99)
Glucose-Capillary: 172 mg/dL — ABNORMAL HIGH (ref 70–99)
Glucose-Capillary: 205 mg/dL — ABNORMAL HIGH (ref 70–99)

## 2022-08-12 LAB — URINE CULTURE: Culture: >=100000 — AB

## 2022-08-12 LAB — PROTIME-INR
INR: 1.6 — ABNORMAL HIGH (ref 0.8–1.2)
Prothrombin Time: 19 seconds — ABNORMAL HIGH (ref 11.4–15.2)

## 2022-08-12 LAB — EXPECTORATED SPUTUM ASSESSMENT W GRAM STAIN, RFLX TO RESP C

## 2022-08-12 LAB — HEPARIN LEVEL (UNFRACTIONATED): Heparin Unfractionated: 0.48 IU/mL (ref 0.30–0.70)

## 2022-08-12 SURGERY — RIGHT HEART CATH
Anesthesia: LOCAL

## 2022-08-12 MED ORDER — SPIRONOLACTONE 25 MG PO TABS
25.0000 mg | ORAL_TABLET | Freq: Every day | ORAL | Status: DC
  Administered 2022-08-12: 25 mg via ORAL
  Filled 2022-08-12: qty 1

## 2022-08-12 MED ORDER — FUROSEMIDE 10 MG/ML IJ SOLN
60.0000 mg | Freq: Three times a day (TID) | INTRAMUSCULAR | Status: DC
  Administered 2022-08-12 – 2022-08-13 (×3): 60 mg via INTRAVENOUS
  Filled 2022-08-12 (×3): qty 6

## 2022-08-12 MED ORDER — SODIUM CHLORIDE 0.9 % IV SOLN
INTRAVENOUS | Status: AC | PRN
  Administered 2022-08-12: 10 mL/h via INTRAVENOUS

## 2022-08-12 MED ORDER — LIDOCAINE HCL (PF) 1 % IJ SOLN
INTRAMUSCULAR | Status: DC | PRN
  Administered 2022-08-12: 2 mL

## 2022-08-12 MED ORDER — POTASSIUM CHLORIDE CRYS ER 20 MEQ PO TBCR
20.0000 meq | EXTENDED_RELEASE_TABLET | Freq: Two times a day (BID) | ORAL | Status: DC
  Administered 2022-08-12 (×2): 20 meq via ORAL
  Filled 2022-08-12 (×2): qty 1

## 2022-08-12 MED ORDER — SODIUM CHLORIDE 0.9 % IV SOLN: INTRAVENOUS | Status: DC

## 2022-08-12 MED ORDER — ASPIRIN 81 MG PO CHEW
81.0000 mg | CHEWABLE_TABLET | ORAL | Status: AC
  Administered 2022-08-12: 81 mg via ORAL
  Filled 2022-08-12: qty 1

## 2022-08-12 MED ORDER — VERAPAMIL HCL 2.5 MG/ML IV SOLN
INTRAVENOUS | Status: AC
  Filled 2022-08-12: qty 2

## 2022-08-12 MED ORDER — HEPARIN (PORCINE) IN NACL 1000-0.9 UT/500ML-% IV SOLN
INTRAVENOUS | Status: DC | PRN
  Administered 2022-08-12: 500 mL

## 2022-08-12 MED ORDER — IPRATROPIUM-ALBUTEROL 0.5-2.5 (3) MG/3ML IN SOLN: 3.0000 mL | RESPIRATORY_TRACT | Status: DC | PRN

## 2022-08-12 MED ORDER — LIDOCAINE HCL (PF) 1 % IJ SOLN
INTRAMUSCULAR | Status: AC
  Filled 2022-08-12: qty 30

## 2022-08-12 MED ORDER — HEPARIN (PORCINE) IN NACL 1000-0.9 UT/500ML-% IV SOLN
INTRAVENOUS | Status: AC
  Filled 2022-08-12: qty 1000

## 2022-08-12 MED ORDER — HEPARIN SODIUM (PORCINE) 1000 UNIT/ML IJ SOLN
INTRAMUSCULAR | Status: AC
  Filled 2022-08-12: qty 10

## 2022-08-12 SURGICAL SUPPLY — 7 items
CATH BALLN WEDGE 5F 110CM (CATHETERS) IMPLANT
GUIDEWIRE .025 260CM (WIRE) IMPLANT
GUIDEWIRE INQWIRE 1.5J.035X260 (WIRE) IMPLANT
INQWIRE 1.5J .035X260CM (WIRE) ×1
PACK CARDIAC CATHETERIZATION (CUSTOM PROCEDURE TRAY) ×1 IMPLANT
SHEATH GLIDE SLENDER 4/5FR (SHEATH) IMPLANT
TRANSDUCER W/STOPCOCK (MISCELLANEOUS) ×1 IMPLANT

## 2022-08-12 NOTE — Progress Notes (Addendum)
Daily Progress Note Intern Pager: [phone]  Patient name: Candice Mclean Medical record number: [id number] Date of birth: September 24, 1946 Age: 76 y.o. Gender: female  Primary Care Provider: Janene Mclean, Candice Jefferson, MD Consultants: Cardiology, heart failure, CCM Code Status: Full code  Pt Overview and Major Events to Date:  9/28: Admitted to F MTS 9/30: Rapid response hypoxia, transition to BiPAP, chest x-ray bilateral opacities, ABG 7.44 pH. 10/2: Patient became febrile, repeated urine and blood cultures, switch ceftriaxone to cefepime 10/3: Heart Cath  Assessment and Plan: Candice Mclean is a 76 year old female presenting with sudden onset dysphagia with associated vomiting and subsequently developed SOB.  Pertinent past medical history includes first-degree AVB, thyroid nodule, asthma, renal artery embolism, syncope, PAF, SSS status post Medtronic dual-chamber pacemaker.    * Vomiting Resolved.  Tolerating p.o. Protonix. - Protonix - Regular diet with monitoring aspiration risk   Acute respiratory failure with hypoxia (HCC) Patient on 20 L high flow nasal cannula, respiratory therapy is continuing to wean as tolerated. Patient had fever yesterday, sputum culture showed gram-negative rods, we switched ceftriaxone to cefepime.  Differential includes viral v. bacterial pneumonia, pulmonary arterial hypertension, decompensated diastolic heart failure, obesity hypoventilation syndrome and OSA.  - Cefepime (Last dose on 10/4), azithromycin (Last dose on 10/5) for atypical coverage per pulm - BiPAP at night (patient tolerating well) - As needed DuoNebs every 4 for wheezing - Incentive spirometry, pulmonary hygiene - CCM following, we appreciate recs - Monitor oxygen saturation, wean as tolerated - Pending strep/Legionella urine ag     Pulmonary hypertension, primary [facility]) Cardiology on board. Heart cath today. 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 Monitor, rate controlled currently. -Heparin -Therapeutic heparin levels -Home metoprolol 35m BID   CHF (congestive heart failure) (HCC) Continue diuresis per cardiology.  Net down 3.6 L -Lasix 40 mg IV every 8 hours -Aldactone 12.5 mg daily   Diabetes (HCC) BGL 141-195.  -sSSI -CBG q6  Abdominal pain No bowel movement since Wednesday of last week, does not endorse abdominal pain at this time.  Patient not ambulating, has poor oral intake.  We will continue current regimen and continue to reassess. -Miralax  -Senna  Extremity pain Still experiencing low back and bilateral leg pain.  Some improvement with gabapentin, can consider going up on medication. - 100 mg gabapentin 3 times daily   FEN/GI: Regular PPx: Heparin Dispo:SNF pending clinical improvement .   Subjective:  Patient reports that she is feeling no different from yesterday.  Still having pain in her legs.  She will be undergoing a heart cath today.  Not ambulating, no bowel movement.  Objective: Temp:  [97.9 F (36.6 C)-101 F (38.3 C)] 97.9 F (36.6 C) (10/03 1213) Pulse Rate:  [96-121] 100 (10/03 1213) Resp:  [18-29] 23 (10/03 1213) BP: (114-125)/(72-81) 125/72 (10/03 1213) SpO2:  [93 %-98 %] 98 % (10/03 1213) FiO2 (%):  [40 %] 40 % (10/03 0753) Physical Exam: General: Not in acute distress, chronically ill-appearing Cardiovascular: Regular rate and rhythm, no MRG Respiratory: Patient was status post breathing treatment, coarse breath sounds without wheezing bilaterally, normal work of breathing on 20 L HFNC. Extremities: Minimal leg edema  Laboratory: Most recent CBC Lab Results  Component Value Date   WBC 8.7 08/12/2022   HGB 11.7 (L) 08/12/2022   HCT 33.5 (L)  08/12/2022   MCV 78.8 (L) 08/12/2022   PLT 279 08/12/2022   Most recent  BMP    Latest Ref Rng & Units 08/12/2022    4:14 AM  BMP  Glucose 70 - 99 mg/dL 161   BUN 8 - 23 mg/dL 22   Creatinine 0.44 - 1.00 mg/dL 1.10   Sodium 135 - 145 mmol/L 134   Potassium 3.5 - 5.1 mmol/L 4.4   Chloride 98 - 111 mmol/L 97   CO2 22 - 32 mmol/L 27   Calcium 8.9 - 10.3 mg/dL 9.2     Candice Dales, DO 08/12/2022, 2:15 PM  PGY-1, [place] Intern pager: [phone], text pages welcome Secure chat group [place]

## 2022-08-12 NOTE — Progress Notes (Signed)
FMTS Interim Progress Note  S: I evaluated patient at bedside status post heart cath.  Patient reports that she is feeling well, feels that she is breathing better, leg pain is improving with gabapentin.  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  Patient has tolerated heart cath well.  Patient's breathing is improving.  Respiratory therapy is appropriately weaning patient's oxygen, and patient is tolerating well. - 1 more day of Lasix - Arlyce Harman was increased to 25 mg daily, monitor K - Continue to wean oxygen - Continue antibiotics - Follow-up urine strep/Legionella - As needed DuoNeb - Ensure CPAP/BiPAP on discharge  [name], DO 08/12/2022, 1:47 PM PGY-1, [place] Medicine Service pager [phone]

## 2022-08-12 NOTE — H&P (View-Only) (Signed)
Advanced Heart Failure Rounding Note  PCP-Cardiologist: Sanda Klein, MD   Subjective:   AHF team asked to see pt d/t new findings in ECHO 9/28 suggesting severe pulmonary hypertension.  NPO, plan for RHC today to access hemodynamics and R sided pressures.   Sleepy in bed. Denies CP and SOB.   Echo 9/28: EF 55-60%, RV mildly enlarged, LA/RA mod dilated, severely elevated PASP est 68.6, no MR, severe TR. Objective:   Weight Range: 116.1 kg Body mass index is 46.81 kg/m.   Vital Signs:   Temp:  [98.5 F (36.9 C)-101 F (38.3 C)] 98.6 F (37 C) (10/03 0521) Pulse Rate:  [91-121] 102 (10/03 0521) Resp:  [18-29] 18 (10/03 0521) BP: (114-130)/(72-83) 117/72 (10/03 0521) SpO2:  [92 %-99 %] 97 % (10/03 0521) FiO2 (%):  [40 %-50 %] 40 % (10/03 0408) Weight:  [116.1 kg] 116.1 kg (10/02 1233) Last BM Date : 08/07/22  Weight change: Filed Weights   08/07/22 1337 08/09/22 0405 08/11/22 1233  Weight: 115.7 kg 116.1 kg 116.1 kg    Intake/Output:   Intake/Output Summary (Last 24 hours) at 08/12/2022 0734 Last data filed at 08/12/2022 0400 Gross per 24 hour  Intake 711.22 ml  Output 2000 ml  Net -1288.78 ml      Physical Exam    General:  elderly appearing.  No respiratory difficulty HEENT: normal Neck: supple. JVD ~14 w/ prominent v waves. Carotids 2+ bilat; no bruits. No lymphadenopathy or thyromegaly appreciated. Cor: PMI nondisplaced. Irregular rate & rhythm. No rubs, gallops or murmurs. Lungs: clear, diminished bases Abdomen: soft, nontender, nondistended. No hepatosplenomegaly. No bruits or masses. Good bowel sounds. Extremities: no cyanosis, clubbing, rash, non-pitting BLE edema  Neuro: alert & oriented x 3, cranial nerves grossly intact. moves all 4 extremities w/o difficulty. Affect pleasant.   Telemetry   A fib low 100s (Personally reviewed)    EKG    No new EKG to review  Labs    CBC Recent Labs    08/11/22 0148 08/12/22 0414  WBC 8.0 8.7   HGB 12.2 11.7*  HCT 34.6* 33.5*  MCV 80.1 78.8*  PLT 244 184   Basic Metabolic Panel Recent Labs    08/10/22 1001 08/11/22 0148 08/11/22 1611 08/12/22 0414  NA 136   < > 131* 134*  K 3.7   < > 4.3 4.4  CL 98   < > 94* 97*  CO2 27   < > 27 27  GLUCOSE 147*   < > 135* 161*  BUN 20   < > 19 22  CREATININE 1.05*   < > 1.15* 1.10*  CALCIUM 9.3   < > 8.9 9.2  MG 1.9  --   --   --    < > = values in this interval not displayed.   Liver Function Tests No results for input(s): "AST", "ALT", "ALKPHOS", "BILITOT", "PROT", "ALBUMIN" in the last 72 hours. No results for input(s): "LIPASE", "AMYLASE" in the last 72 hours. Cardiac Enzymes No results for input(s): "CKTOTAL", "CKMB", "CKMBINDEX", "TROPONINI" in the last 72 hours.  BNP: BNP (last 3 results) Recent Labs    08/07/22 1008  BNP 136.0*    ProBNP (last 3 results) No results for input(s): "PROBNP" in the last 8760 hours.   D-Dimer No results for input(s): "DDIMER" in the last 72 hours. Hemoglobin A1C No results for input(s): "HGBA1C" in the last 72 hours. Fasting Lipid Panel No results for input(s): "CHOL", "HDL", "LDLCALC", "TRIG", "CHOLHDL", "LDLDIRECT"  in the last 72 hours. Thyroid Function Tests No results for input(s): "TSH", "T4TOTAL", "T3FREE", "THYROIDAB" in the last 72 hours.  Invalid input(s): "FREET3"  Other results:   Imaging    No results found.   Medications:     Scheduled Medications:  azithromycin  250 mg Oral Daily   diclofenac Sodium  2 g Topical QID   furosemide  40 mg Intravenous Q8H   gabapentin  100 mg Oral TID   influenza vaccine adjuvanted  0.5 mL Intramuscular Tomorrow-1000   insulin aspart  0-9 Units Subcutaneous TID WC   ipratropium-albuterol  3 mL Nebulization BID   metoprolol tartrate  25 mg Oral BID   pantoprazole  40 mg Oral BID   polyethylene glycol  17 g Oral Daily   potassium chloride  40 mEq Oral BID   senna-docusate  1 tablet Oral Daily   spironolactone  12.5  mg Oral Daily    Infusions:  sodium chloride 10 mL/hr at 08/12/22 0538   ceFEPime (MAXIPIME) IV 2 g (08/12/22 0224)   heparin 1,400 Units/hr (08/12/22 0145)    PRN Medications: acetaminophen, lip balm, phenol, sodium chloride    Patient Profile   Candice Mclean is a 76 y.o AAF with PMH of HTN, DM, Afib on warfarin, syncope and pauses s/p MDT PPM, HLD, HFpEF, and PE 17'. AHF team asked to see for New pulmonary HTN.   Assessment/Plan  New Pulmonary Hypertension - ECHO on admission revealed severe pulm HTN, RV mildly enlarged, PASP est 68.6, severe TR - RHC today to access hemodynamics and R sided pressures. 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 RV mildly enlarged, LA/RA mod dilated, severely elevated PASP est 68.6, no MR, severe TR - Presented NYHA IV on admission - admission CXR showed: New infiltrates in the right lower lung favored to represent edema, Cardiomegaly and pulmonary vascular congestion, now improved - CT chest (-) PE, Morphologic changes in keeping with PAH and at least some degree of right heart failure - Increase lasix 40 mg> 60 mg TID, -2 L UOP, weight unchanged.  - Increase spiro 12.5>25 daily, monitor K - Strict I&O, daily weights   Acute hypoxemic respiratory failure - Originally presented w. 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  - Rate controlled on exam  - On Warfarin at home - Continue heparin gtt 2/2 not being able to keep medications down. INR goal  2, 1.6 today - Continue metoprolol 31m BID - needs OP sleep study - [facility]   HLD - needs lipid panel, may need statin   7. Hypokalemia - 4.4 today   8. N/V - now resolved, GI saw IP   9. DM2 - Hgb A1c 6.7 - per primary  Length of Stay: [address] AGACNP-BC  08/12/2022, 7:34 AM  Advanced Heart Failure Team Pager [phone](M-F; 7a - 5p)  Please contact [facility] for night-coverage after hours (5p -  7a ) and weekends on [email or web address]   Patient seen and examined with the above-signed Advanced Practice Provider and/or Housestaff. I personally reviewed laboratory data, imaging studies and relevant notes. I independently examined the patient and formulated the important aspects of the plan. I have edited the note to reflect any of my changes or salient points. I have personally discussed the plan with the patient and/or family.  Diuresing well with IV lasix. No daily weight recorded.  Remains SOB and weak. Remains in chronic AF. Using Bipap at night.   General:  Weak appearing. Lying in bed No resp difficulty HEENT: normal Neck: supple. JVP to ear with prominent v waves. Carotids 2+ bilat; no bruits. No lymphadenopathy or thryomegaly appreciated. Cor: PMI nondisplaced. Regular rate & rhythm. 2/6 TR Lungs: clear Abdomen: obese soft, nontender, nondistended. No hepatosplenomegaly. No bruits or masses. Good bowel sounds. Extremities: no cyanosis, clubbing, rash, 1+ edema Neuro: alert & orientedx3, cranial nerves grossly intact. moves all 4 extremities w/o difficulty. Affect pleasant  Continue diuresis. Will plan RHC today. 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 Will need home BIPAP. AF rate controlled. Continue AC.   [name], MD  10:31 AM

## 2022-08-12 NOTE — Progress Notes (Signed)
NAME:  Candice Mclean, MRN:  [id number], DOB:  1946/06/11, LOS: 4 ADMISSION DATE:  08/06/2022, CONSULTATION DATE:  08/12/22 REFERRING MD:  Graciella Freer, CHIEF COMPLAINT:  hypoxia   History of Present Illness:  Candice Mclean is a 76 y.o. 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  She states that she had a mildly productive cough with some chills for several days prior and one sick contact.    In the ED, she was hypoxic to upper 80%'s requiring [place] oxygen, Covid negative,  CT chest with patchy infiltrates suggestive of pneumonia and febrile to 101F.  She was given initial Vanc and Cefepime with IVF and admitted to family medicine team.    Overnight, she had an episode of desaturation and required 15L HFNC, CTA chest negative for PE.  Cardiology was consulted when echo showed signs of severe PAH and tricuspid regurgitation and Lasix 52m bid initiation, consider RH cath. Troponins were trended and flat.   PCCM consulted in this setting   Pt reports a history of asthma, only uses albuterol for this and no recent severe exacerbations.  She has never been a smoker and does not use oxygen at home. Never been diagnosed with COPD or OSA  Pertinent  Medical History   has a past medical history of [place]) ([place], [place]F (congestive heart failure) ([place], Coronary artery disease, Diabetes mellitus without complication ([place], Hypertension, and Medtronic pacemaker (2015).   Significant Hospital Events: Including procedures, antibiotic start and stop dates in addition to other pertinent events   9/28 admitted for pneumonia to FMTS 9/29 hypoxic event, PCCM consult, on 15L [place].  CTA negative for PE  Interim History / Subjective:  Patient just returned from cath lab Patient states breathing is about the same ON HHFNC 20 l/m and 40%  fio2 Wore bipap overnight for 5 hours and tolerated well CXR this am showing improved opacities in LLL  w/ stable infrahilar hazy opacity Afebrile this am UOP 2L last 24 hours  Objective   Blood pressure 119/77, pulse (!) 108, temperature 98.7 F (37.1 C), temperature source Oral, resp. rate 18, height _0  (1.575 m), weight 116.1 kg, SpO2 98 %.    FiO2 (%):  [40 %] 40 %   Intake/Output Summary (Last 24 hours) at 08/12/2022 1212 Last data filed at 08/12/2022 0400 Gross per 24 hour  Intake 711.22 ml  Output 2000 ml  Net -1288.78 ml    Filed Weights   08/07/22 1337 08/09/22 0405 08/11/22 1233  Weight: 115.7 kg 116.1 kg 116.1 kg   General:  NAD on hhfnc HEENT: MM pink/moist; HPiggottin place Neuro: Aox3; MAE CV: s1s2, RRR, no m/r/g PULM:  dim clear BS bilaterally; HHFNC 40 l/m and 40% fio2 GI: soft, bsx4 active  Extremities: warm/dry, trace ble edema w/ pain upon palpation Skin: no rashes or lesions appreciated  Resolved Hospital Problem list     Assessment & Plan:   Acute Hypoxic Respiratory Failure  Viral pneumonia due to rhino virua ?Pneumonitis  ?Pulmonary Hypertension History of Asthma -Viral panel + for rhinovirus and CT chest with patchy infiltrates -Echo with preserved EF and severe tricuspid regurgitation and mild RV enlargement -Procal 0.12 > 0.10 -sputum culture for productive cough ordered 10/1>> Mono-nuclear with few gram + cocci in clusters and few gram negative rods.  P: -Cards following; appreciate  recs -diuresis per cards -wean hhfnc for sats >92% -bipap qhs and prn for naps -continue to f/u expectorated sputum culture -f/u urine strep/legionella from 10/2 -continue rocephing/azithro -pulm toiletry: IS/flutter -OOB as tolerated; PT/OT -trend CXR -prn duoneb for wheezing -will need outpatient sleep study   Best Practice (right click and "Reselect all SmartList Selections" daily)   Per primary      JD Rexene Agent [facility] 08/12/2022, 12:40 PM  Please see [email or web address] for pager details.  From 7A-7P if no response, please call [phone]. After hours, please call ELink [phone].

## 2022-08-12 NOTE — Progress Notes (Signed)
ANTICOAGULATION CONSULT NOTE  Pharmacy Consult for heparin  Indication: atrial fibrillation  Allergies  Allergen Reactions   Versed [Midazolam] Hives   Tylenol With Codeine #3 [Acetaminophen-Codeine] Palpitations    Patient Measurements: Height: _0  (157.5 cm) Weight: 116.1 kg (255 lb 15.3 oz) IBW/kg (Calculated) : 50.1 Heparin Dosing Weight: 78.7kg   Vital Signs: Temp: 97.9 F (36.6 C) (10/03 1213) Temp Source: Oral (10/03 1213) BP: 125/72 (10/03 1213) Pulse Rate: 100 (10/03 1213)  Labs: Recent Labs    08/10/22 1001 08/10/22 1834 08/11/22 0148 08/11/22 1042 08/11/22 1611 08/12/22 0414  HGB 11.9*  --  12.2  --   --  11.7*  HCT 34.4*  --  34.6*  --   --  33.5*  PLT 231  --  244  --   --  279  LABPROT 20.0*  --   --  20.1*  --  19.0*  INR 1.7*  --   --  1.7*  --  1.6*  HEPARINUNFRC  --    < > 0.27* 0.39 0.52 0.48  CREATININE 1.05*  --  1.09*  --  1.15* 1.10*   < > = values in this interval not displayed.     Estimated Creatinine Clearance: 55 mL/min (A) (by C-G formula based on SCr of 1.1 mg/dL (H)).   Medical History: Past Medical History:  Diagnosis Date   AF (atrial fibrillation) (HCC)    CHF (congestive heart failure) (HCC)    Coronary artery disease    Diabetes mellitus without complication (HCC)    Hypertension    Medtronic pacemaker 2015    Medications:  Scheduled:   azithromycin  250 mg Oral Daily   diclofenac Sodium  2 g Topical QID   furosemide  60 mg Intravenous Q8H   gabapentin  100 mg Oral TID   insulin aspart  0-9 Units Subcutaneous TID WC   metoprolol tartrate  25 mg Oral BID   pantoprazole  40 mg Oral BID   polyethylene glycol  17 g Oral Daily   potassium chloride  20 mEq Oral BID   senna-docusate  1 tablet Oral Daily   spironolactone  25 mg Oral Daily   Infusions:   ceFEPime (MAXIPIME) IV 2 g (08/12/22 0925)   heparin 1,400 Units/hr (08/12/22 0145)    Assessment: 76 YO female presenting 9/28 with sudden onset dysphagia,  associated vomiting, and subsequently developed SOB. Patient has a history of afib with PTA warfarin regimen of 7.5 mg on Sun/Mon/Thurs and 5 mg on all other days--no doses since admission. Patient's INR was 2.5 on admission and heparin was started 10/1 after INR < 2.   Heparin level today is therapeutic at 0.48, on 1400 units/hr. Hgb 11.7, plt 279--stable. No line issues or signs/symptoms of bleeding reported.   Goal of Therapy:  Heparin level 0.3-0.7 units/ml Monitor platelets by anticoagulation protocol: Yes   Plan:  Continue heparin 1400 units/hr Monitor heparin level, CBC, and s/sx of bleeding daily    Angus Seller, PharmD Candidate [employer]  [phone] 10/3/20231:17 PM

## 2022-08-12 NOTE — Progress Notes (Signed)
Patient taken off BiPAP at this time. Patient tolerated BiPAP well for 5 hours. Patient placed on heated HFNC 20 Lpm, 40% O2. Patient in no distress. SATs 96%. RN made aware.

## 2022-08-12 NOTE — Interval H&P Note (Signed)
History and Physical Interval Note:  08/12/2022 10:32 AM  Candice Mclean  has presented today for surgery, with the diagnosis of PAH.  The various methods of treatment have been discussed with the patient and family. After consideration of risks, benefits and other options for treatment, the patient has consented to  Procedure(s): RIGHT/LEFT HEART CATH AND CORONARY ANGIOGRAPHY (N/A) as a surgical intervention.  The patient's history has been reviewed, patient examined, no change in status, stable for surgery.  I have reviewed the patient's chart and labs.  Questions were answered to the patient's satisfaction.     Ko Bardon

## 2022-08-12 NOTE — TOC Initial Note (Signed)
Transition of Care [facility]) - Initial/Assessment Note    Patient Details  Name: Candice Mclean MRN: [id number] Date of Birth: 05/22/1946  Transition of Care [facility]) CM/SW Contact:    Erenest Rasher, RN Phone Number: [phone] 08/12/2022, 3:06 PM  Clinical Narrative:                 HF TOC CM spoke to pt and grandson at bedside. Pt is visiting daughter and will stay with dtr until she is medically safe to travel back to [facility]. CM spoke to attending about Bipap/Trilogy for home. Pt will need Pulmonary Function Test for DME, updated attending. [place] and will follow up on BIPAP or NIV. Pt reports she has scale and weighs daily. Will continue to follow for dc needs.   Expected Discharge Plan: [place] Barriers to Discharge: Continued Medical Work up   Patient Goals and CMS Choice Patient states their goals for this hospitalization and ongoing recovery are:: wants to remain independent CMS Medicare.gov Compare Post Acute Care list provided to:: Patient    Expected Discharge Plan and Services Expected Discharge Plan: [place]   Discharge Planning Services: CM Consult Post Acute Care Choice: [place] arrangements for the past 2 months: Single Family Home                   DME Agency: [employer] Date DME Agency Contacted: 08/12/22 Time DME Agency Contacted: 1501 Representative spoke with at DME Agency: Thedore Mins            Prior Living Arrangements/Services Living arrangements for the past 2 months: [place] with:: Self Patient language and need for interpreter reviewed:: Yes Do you feel safe going back to the place where you live?: Yes      Need for Family Participation in Patient Care: Yes (Comment) Care giver support system in place?: Yes (comment) Current home services: DME (scale) Criminal Activity/Legal Involvement Pertinent to Current Situation/Hospitalization: No - Comment  as needed  Activities of Daily Living Home Assistive Devices/Equipment: Cane (specify quad or straight), Eyeglasses ADL Screening (condition at time of admission) Patient's cognitive ability adequate to safely complete daily activities?: Yes Is the patient deaf or have difficulty hearing?: No Does the patient have difficulty seeing, even when wearing glasses/contacts?: No Does the patient have difficulty concentrating, remembering, or making decisions?: No Patient able to express need for assistance with ADLs?: Yes Does the patient have difficulty dressing or bathing?: Yes Independently performs ADLs?: Yes (appropriate for developmental age) Does the patient have difficulty walking or climbing stairs?: Yes Weakness of Legs: None Weakness of Arms/Hands: None  Permission Sought/Granted Permission sought to share information with : Family Supports, Case Manager, PCP Permission granted to share information with : Yes, Verbal Permission Granted  Share Information with NAME: Antionette Surgeon     Permission granted to share info w Relationship: mother  Permission granted to share info w Contact Information: [phone]  Emotional Assessment Appearance:: Appears stated age Attitude/Demeanor/Rapport: Engaged Affect (typically observed): Pleasant Orientation: : Oriented to Self, Oriented to Place, Oriented to  Time, Oriented to Situation   Psych Involvement: No (comment)  Admission diagnosis:  SIRS (systemic inflammatory response syndrome) ([place]) [R65.10] Pneumonia of both lower lobes due to infectious organism [J18.9] Patient Active Problem List   Diagnosis Date Noted   Pulmonary hypertension, unspecified ([place])    Extremity pain 08/10/2022   Abdominal pain 08/10/2022   Pulmonary hypertension,  primary ([place]) 08/08/2022   SIRS (systemic inflammatory response syndrome) ([place]) 08/08/2022   Acute respiratory failure with hypoxia ([place]) 08/07/2022   Dysphagia    Vomiting    CHF  (congestive heart failure) ([place])    Diabetes ([place]) 01/10/2017   Dyspnea 01/10/2017   Chest pain 01/10/2017   Costochondritis 01/10/2017   A-fib ([place]) 01/10/2017   PCP:  Janene Madeira, Vernice Jefferson, MD Pharmacy:   [id number]Phone: [phone]: [phone]    Social Determinants of Health (SDOH) Interventions    Readmission Risk Interventions     No data to display

## 2022-08-12 NOTE — Progress Notes (Addendum)
Advanced Heart Failure Rounding Note  PCP-Cardiologist: Sanda Klein, MD   Subjective:   AHF team asked to see pt d/t new findings in ECHO 9/28 suggesting severe pulmonary hypertension.  NPO, plan for RHC today to access hemodynamics and R sided pressures.   Sleepy in bed. Denies CP and SOB.   Echo 9/28: EF 55-60%, RV mildly enlarged, LA/RA mod dilated, severely elevated PASP est 68.6, no MR, severe TR. Objective:   Weight Range: 116.1 kg Body mass index is 46.81 kg/m.   Vital Signs:   Temp:  [98.5 F (36.9 C)-101 F (38.3 C)] 98.6 F (37 C) (10/03 0521) Pulse Rate:  [91-121] 102 (10/03 0521) Resp:  [18-29] 18 (10/03 0521) BP: (114-130)/(72-83) 117/72 (10/03 0521) SpO2:  [92 %-99 %] 97 % (10/03 0521) FiO2 (%):  [40 %-50 %] 40 % (10/03 0408) Weight:  [116.1 kg] 116.1 kg (10/02 1233) Last BM Date : 08/07/22  Weight change: Filed Weights   08/07/22 1337 08/09/22 0405 08/11/22 1233  Weight: 115.7 kg 116.1 kg 116.1 kg    Intake/Output:   Intake/Output Summary (Last 24 hours) at 08/12/2022 0734 Last data filed at 08/12/2022 0400 Gross per 24 hour  Intake 711.22 ml  Output 2000 ml  Net -1288.78 ml      Physical Exam    General:  elderly appearing.  No respiratory difficulty HEENT: normal Neck: supple. JVD ~14 w/ prominent v waves. Carotids 2+ bilat; no bruits. No lymphadenopathy or thyromegaly appreciated. Cor: PMI nondisplaced. Irregular rate & rhythm. No rubs, gallops or murmurs. Lungs: clear, diminished bases Abdomen: soft, nontender, nondistended. No hepatosplenomegaly. No bruits or masses. Good bowel sounds. Extremities: no cyanosis, clubbing, rash, non-pitting BLE edema  Neuro: alert & oriented x 3, cranial nerves grossly intact. moves all 4 extremities w/o difficulty. Affect pleasant.   Telemetry   A fib low 100s (Personally reviewed)    EKG    No new EKG to review  Labs    CBC Recent Labs    08/11/22 0148 08/12/22 0414  WBC 8.0 8.7   HGB 12.2 11.7*  HCT 34.6* 33.5*  MCV 80.1 78.8*  PLT 244 184   Basic Metabolic Panel Recent Labs    08/10/22 1001 08/11/22 0148 08/11/22 1611 08/12/22 0414  NA 136   < > 131* 134*  K 3.7   < > 4.3 4.4  CL 98   < > 94* 97*  CO2 27   < > 27 27  GLUCOSE 147*   < > 135* 161*  BUN 20   < > 19 22  CREATININE 1.05*   < > 1.15* 1.10*  CALCIUM 9.3   < > 8.9 9.2  MG 1.9  --   --   --    < > = values in this interval not displayed.   Liver Function Tests No results for input(s): "AST", "ALT", "ALKPHOS", "BILITOT", "PROT", "ALBUMIN" in the last 72 hours. No results for input(s): "LIPASE", "AMYLASE" in the last 72 hours. Cardiac Enzymes No results for input(s): "CKTOTAL", "CKMB", "CKMBINDEX", "TROPONINI" in the last 72 hours.  BNP: BNP (last 3 results) Recent Labs    08/07/22 1008  BNP 136.0*    ProBNP (last 3 results) No results for input(s): "PROBNP" in the last 8760 hours.   D-Dimer No results for input(s): "DDIMER" in the last 72 hours. Hemoglobin A1C No results for input(s): "HGBA1C" in the last 72 hours. Fasting Lipid Panel No results for input(s): "CHOL", "HDL", "LDLCALC", "TRIG", "CHOLHDL", "LDLDIRECT"  in the last 72 hours. Thyroid Function Tests No results for input(s): "TSH", "T4TOTAL", "T3FREE", "THYROIDAB" in the last 72 hours.  Invalid input(s): "FREET3"  Other results:   Imaging    No results found.   Medications:     Scheduled Medications:  azithromycin  250 mg Oral Daily   diclofenac Sodium  2 g Topical QID   furosemide  40 mg Intravenous Q8H   gabapentin  100 mg Oral TID   influenza vaccine adjuvanted  0.5 mL Intramuscular Tomorrow-1000   insulin aspart  0-9 Units Subcutaneous TID WC   ipratropium-albuterol  3 mL Nebulization BID   metoprolol tartrate  25 mg Oral BID   pantoprazole  40 mg Oral BID   polyethylene glycol  17 g Oral Daily   potassium chloride  40 mEq Oral BID   senna-docusate  1 tablet Oral Daily   spironolactone  12.5  mg Oral Daily    Infusions:  sodium chloride 10 mL/hr at 08/12/22 0538   ceFEPime (MAXIPIME) IV 2 g (08/12/22 0224)   heparin 1,400 Units/hr (08/12/22 0145)    PRN Medications: acetaminophen, lip balm, phenol, sodium chloride    Patient Profile   Candice Mclean is a 76 y.o AAF with PMH of HTN, DM, Afib on warfarin, syncope and pauses s/p MDT PPM, HLD, HFpEF, and PE 17'. AHF team asked to see for New pulmonary HTN.   Assessment/Plan  New Pulmonary Hypertension - ECHO on admission revealed severe pulm HTN, RV mildly enlarged, PASP est 68.6, severe TR - RHC today to access hemodynamics and R sided pressures. 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 RV mildly enlarged, LA/RA mod dilated, severely elevated PASP est 68.6, no MR, severe TR - Presented NYHA IV on admission - admission CXR showed: New infiltrates in the right lower lung favored to represent edema, Cardiomegaly and pulmonary vascular congestion, now improved - CT chest (-) PE, Morphologic changes in keeping with PAH and at least some degree of right heart failure - Increase lasix 40 mg> 60 mg TID, -2 L UOP, weight unchanged.  - Increase spiro 12.5>25 daily, monitor K - Strict I&O, daily weights   Acute hypoxemic respiratory failure - Originally presented w. 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  - Rate controlled on exam  - On Warfarin at home - Continue heparin gtt 2/2 not being able to keep medications down. INR goal  2, 1.6 today - Continue metoprolol 31m BID - needs OP sleep study - [facility]   HLD - needs lipid panel, may need statin   7. Hypokalemia - 4.4 today   8. N/V - now resolved, GI saw IP   9. DM2 - Hgb A1c 6.7 - per primary  Length of Stay: [address] AGACNP-BC  08/12/2022, 7:34 AM  Advanced Heart Failure Team Pager [phone](M-F; 7a - 5p)  Please contact [facility] for night-coverage after hours (5p -  7a ) and weekends on [email or web address]   Patient seen and examined with the above-signed Advanced Practice Provider and/or Housestaff. I personally reviewed laboratory data, imaging studies and relevant notes. I independently examined the patient and formulated the important aspects of the plan. I have edited the note to reflect any of my changes or salient points. I have personally discussed the plan with the patient and/or family.  Diuresing well with IV lasix. No daily weight recorded.  Remains SOB and weak. Remains in chronic AF. Using Bipap at night.   General:  Weak appearing. Lying in bed No resp difficulty HEENT: normal Neck: supple. JVP to ear with prominent v waves. Carotids 2+ bilat; no bruits. No lymphadenopathy or thryomegaly appreciated. Cor: PMI nondisplaced. Regular rate & rhythm. 2/6 TR Lungs: clear Abdomen: obese soft, nontender, nondistended. No hepatosplenomegaly. No bruits or masses. Good bowel sounds. Extremities: no cyanosis, clubbing, rash, 1+ edema Neuro: alert & orientedx3, cranial nerves grossly intact. moves all 4 extremities w/o difficulty. Affect pleasant  Continue diuresis. Will plan RHC today. 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 Will need home BIPAP. AF rate controlled. Continue AC.   [name], MD  10:31 AM

## 2022-08-12 NOTE — Progress Notes (Signed)
Physical Therapy Treatment Patient Details Name: Candice Mclean MRN: [id number] DOB: 1946-10-20 Today's Date: 08/12/2022   History of Present Illness 76 y.o. female presenting with sudden onset dysphagia with associated vomiting and subsequently developed SOB. s/p RHC on 10/3. 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    PT Comments    Pt received in supine, lethargic and participatory in supine exercises and minimal bed mobility with encouragement from PTA and pt grandson who was present in room. Pt needing up to modA +2 for supine to long sit transfer and c/o severe LBP with upright posture, tolerating less than 90 seconds in long sitting. Pt instructed on RUE/no bending precautions for 24 hours post-cath and no lifting >10 lb, handout given to reinforce, pt reminded not to pull with RUE on bed rails while repositioning in bed. Defer EOB/OOB due to pt continued lethargy, SpO2 WFL on ~6L HHFNC with supine activities. Pt instructed on use of IS, LE exercises and hand/wrist exercises within precautions, importance to roll q2H while in supine and plan to get OOB to chair next date with nursing/therapies pending progress. Pt continues to benefit from PT services to progress toward functional mobility goals.   Recommendations for follow up therapy are one component of a multi-disciplinary discharge planning process, led by the attending physician.  Recommendations may be updated based on patient status, additional functional criteria and insurance authorization.  Follow Up Recommendations  Skilled nursing-short term rehab (<3 hours/day) Can patient physically be transported by private vehicle: No   Assistance Recommended at Discharge Frequent or constant Supervision/Assistance  Patient can return  home with the following A lot of help with walking and/or transfers;A little help with bathing/dressing/bathroom;Assistance with cooking/housework;Direct supervision/assist for medications management;Direct supervision/assist for financial management;Assist for transportation;Help with stairs or ramp for entrance   Equipment Recommendations  None recommended by PT (TBD)    Recommendations for Other Services       Precautions / Restrictions Precautions Precautions: Fall Precaution Comments: s/p RHC with RUE precs (handout given to reinforce no bending first 24 hours); watch O2 sats and HR; Droplet precs Restrictions Weight Bearing Restrictions: No Other Position/Activity Restrictions: s/p RHC 10/3     Mobility  Bed Mobility Overal bed mobility: Needs Assistance Bed Mobility: Supine to Sit, Sit to Supine   Sidelying to sit: Mod assist Supine to sit: Mod assist, +2 for physical assistance, HOB elevated Sit to supine: Mod assist, +2 for physical assistance   General bed mobility comments: assist to elevate trunk to long sitting, poor tolerance for this posture, pillow adjusted behind her in long sitting; pt defers EOB due to fatigue and noted to be lethargic even after repositioning. Bed elevated to chair posture and pt reporting discomfort but agreeable to attempt to remain upright in bed at least 30 mins for improved pulmonary clearance.    Transfers                   General transfer comment: pt too lethargic to attempt safely post-heart cath, encouraged pt to attempt next date    Ambulation/Gait               General Gait Details: deferred      Balance Overall balance assessment: Needs assistance Sitting-balance support: Feet supported Sitting balance-Leahy Scale: Poor Sitting balance - Comments: LUE  resting on bed rail, pt unable to pull with RUE post-cath so needing +2 external support at trunk Postural control: Posterior lean     Standing balance  comment: defer due to lethargy                            Cognition Arousal/Alertness: Lethargic, Suspect due to medications Behavior During Therapy: Flat affect Overall Cognitive Status: Impaired/Different from baseline Area of Impairment: Memory, Following commands, Problem solving                     Memory: Decreased recall of precautions Following Commands: Follows one step commands with increased time     Problem Solving: Slow processing, Decreased initiation, Difficulty sequencing, Requires verbal cues General Comments: Slow to perform tasks, pt drowsy after procedure and limited effort, unclear if this is due to sedation post-procedure or overall deconditioning.        Exercises Other Exercises Other Exercises: supine BLE AROM: ankle pumps, SAQ (AA), heel slides x10 reps ea Other Exercises: IS x 10 reps (300-550 mL) Other Exercises: supine BUE AROM: gross grasp/finger extension, wrist flex/ext x10 reps ea; (defer elbow/shld rom post-cath given precs)    General Comments General comments (skin integrity, edema, etc.): SpO2 WFL on 5-6L HHF [place] (appears to be between 5-6L on wall)      Pertinent Vitals/Pain Pain Assessment Pain Assessment: Faces Faces Pain Scale: Hurts even more Pain Location: BUE (elbows and where IV located), B knees with AROM in supine Pain Descriptors / Indicators: Guarding, Grimacing, Discomfort, Sore Pain Intervention(s): Monitored during session, Repositioned, Limited activity within patient's tolerance, Ice applied (pt received with ice under LUE, removed and reviewed 20 mins on/off for cryotherapy precs)     PT Goals (current goals can now be found in the care plan section) Acute Rehab PT Goals Patient Stated Goal: to feel better PT Goal Formulation: With family Time For Goal Achievement: 08/22/22 Progress towards PT goals: Progressing toward goals (slowly)    Frequency    Min 3X/week      PT Plan Current plan  remains appropriate       AM-PAC PT "6 Clicks" Mobility   Outcome Measure  Help needed turning from your back to your side while in a flat bed without using bedrails?: A Lot Help needed moving from lying on your back to sitting on the side of a flat bed without using bedrails?: A Lot Help needed moving to and from a bed to a chair (including a wheelchair)?: Total Help needed standing up from a chair using your arms (e.g., wheelchair or bedside chair)?: Total Help needed to walk in hospital room?: Total Help needed climbing 3-5 steps with a railing? : Total 6 Click Score: 8    End of Session Equipment Utilized During Treatment: Oxygen Activity Tolerance: Patient limited by lethargy Patient left: in bed;with call bell/phone within reach;with family/visitor present;with bed alarm set;Other (comment) (heels floated, bed placed in chair posture) Nurse Communication: Mobility status;Other (comment);Precautions (pt purewick needs to be checked, pads may be wet (her blanket was damp under her leg), pt needs to roll Q2H in supine; RUE precs post-cath) PT Visit Diagnosis: Unsteadiness on feet (R26.81);Other abnormalities of gait and mobility (R26.89);Muscle weakness (generalized) (M62.81);Difficulty in walking, not elsewhere classified (R26.2)     Time: 5631-4970 PT Time Calculation (min) (ACUTE ONLY): 33 min  Charges:  $Therapeutic Exercise: 8-22 mins $Therapeutic Activity: 8-22 mins  Houston Siren., PTA Acute Rehabilitation Services Secure Chat Preferred 9a-5:30pm Office: [place] 08/12/2022, 7:00 PM

## 2022-08-13 ENCOUNTER — Inpatient Hospital Stay (HOSPITAL_COMMUNITY): Payer: Medicare Other

## 2022-08-13 DIAGNOSIS — Q211 Atrial septal defect, unspecified: Secondary | ICD-10-CM

## 2022-08-13 DIAGNOSIS — J9601 Acute respiratory failure with hypoxia: Secondary | ICD-10-CM | POA: Diagnosis not present

## 2022-08-13 LAB — CBC
HCT: 32.6 % — ABNORMAL LOW (ref 36.0–46.0)
Hemoglobin: 11.2 g/dL — ABNORMAL LOW (ref 12.0–15.0)
MCH: 27.4 pg (ref 26.0–34.0)
MCHC: 34.4 g/dL (ref 30.0–36.0)
MCV: 79.7 fL — ABNORMAL LOW (ref 80.0–100.0)
Platelets: 298 10*3/uL (ref 150–400)
RBC: 4.09 MIL/uL (ref 3.87–5.11)
RDW: 19.1 % — ABNORMAL HIGH (ref 11.5–15.5)
WBC: 8.9 10*3/uL (ref 4.0–10.5)
nRBC: 0 % (ref 0.0–0.2)

## 2022-08-13 LAB — BASIC METABOLIC PANEL
Anion gap: 11 (ref 5–15)
BUN: 29 mg/dL — ABNORMAL HIGH (ref 8–23)
CO2: 28 mmol/L (ref 22–32)
Calcium: 9.5 mg/dL (ref 8.9–10.3)
Chloride: 94 mmol/L — ABNORMAL LOW (ref 98–111)
Creatinine, Ser: 1.14 mg/dL — ABNORMAL HIGH (ref 0.44–1.00)
GFR, Estimated: 51 mL/min — ABNORMAL LOW (ref >60)
Glucose, Bld: 152 mg/dL — ABNORMAL HIGH (ref 70–99)
Potassium: 5.4 mmol/L — ABNORMAL HIGH (ref 3.5–5.1)
Sodium: 133 mmol/L — ABNORMAL LOW (ref 135–145)

## 2022-08-13 LAB — GLUCOSE, CAPILLARY
Glucose-Capillary: 181 mg/dL — ABNORMAL HIGH (ref 70–99)
Glucose-Capillary: 152 mg/dL — ABNORMAL HIGH (ref 70–99)
Glucose-Capillary: 164 mg/dL — ABNORMAL HIGH (ref 70–99)
Glucose-Capillary: 152 mg/dL — ABNORMAL HIGH (ref 70–99)

## 2022-08-13 LAB — CULTURE, RESPIRATORY W GRAM STAIN: Culture: NORMAL

## 2022-08-13 LAB — LIPID PANEL
Cholesterol: 118 mg/dL (ref 0–200)
HDL: 36 mg/dL — ABNORMAL LOW (ref >40)
LDL Cholesterol: 68 mg/dL (ref 0–99)
Total CHOL/HDL Ratio: 3.3 RATIO
Triglycerides: 72 mg/dL (ref <150)
VLDL: 14 mg/dL (ref 0–40)

## 2022-08-13 LAB — HEPARIN LEVEL (UNFRACTIONATED): Heparin Unfractionated: 0.38 IU/mL (ref 0.30–0.70)

## 2022-08-13 LAB — PROTIME-INR
INR: 1.5 — ABNORMAL HIGH (ref 0.8–1.2)
Prothrombin Time: 18.2 seconds — ABNORMAL HIGH (ref 11.4–15.2)

## 2022-08-13 LAB — POTASSIUM: Potassium: 4.1 mmol/L (ref 3.5–5.1)

## 2022-08-13 MED ORDER — OXYCODONE HCL 5 MG PO TABS
2.5000 mg | ORAL_TABLET | Freq: Once | ORAL | Status: AC | PRN
  Administered 2022-08-13: 2.5 mg via ORAL
  Filled 2022-08-13: qty 1

## 2022-08-13 MED ORDER — GABAPENTIN 100 MG PO CAPS
200.0000 mg | ORAL_CAPSULE | Freq: Three times a day (TID) | ORAL | Status: DC
  Administered 2022-08-13 – 2022-08-18 (×15): 200 mg via ORAL
  Filled 2022-08-13 (×15): qty 2

## 2022-08-13 MED ORDER — SENNOSIDES-DOCUSATE SODIUM 8.6-50 MG PO TABS
1.0000 | ORAL_TABLET | Freq: Two times a day (BID) | ORAL | Status: DC
  Administered 2022-08-13 – 2022-08-18 (×9): 1 via ORAL
  Filled 2022-08-13 (×10): qty 1

## 2022-08-13 MED ORDER — GABAPENTIN 100 MG PO CAPS
200.0000 mg | ORAL_CAPSULE | Freq: Once | ORAL | Status: AC
  Administered 2022-08-13: 200 mg via ORAL
  Filled 2022-08-13: qty 2

## 2022-08-13 MED ORDER — POLYETHYLENE GLYCOL 3350 17 G PO PACK
17.0000 g | PACK | Freq: Two times a day (BID) | ORAL | Status: DC
  Administered 2022-08-13 – 2022-08-18 (×8): 17 g via ORAL
  Filled 2022-08-13 (×10): qty 1

## 2022-08-13 MED ORDER — SPIRONOLACTONE 25 MG PO TABS
25.0000 mg | ORAL_TABLET | Freq: Every day | ORAL | Status: DC
  Administered 2022-08-13 – 2022-08-18 (×6): 25 mg via ORAL
  Filled 2022-08-13 (×6): qty 1

## 2022-08-13 NOTE — Progress Notes (Addendum)
Advanced Heart Failure Rounding Note  PCP-Cardiologist: Sanda Klein, MD   Subjective:    RHC 10/3 demonstrated mild-mod PAH w/ nl CO and left sided filling pressures and evidence of probable small intracardiac shunt  Continued on IV Lasix post cath. At least 1.1L in UOP charted yesterday (? I/Os complete). Today's wt not charted.   SCr stable. Initial K 5.4 due to Hemolysis. Repeat K 4.1    Pending Echo bubble and TEE.   Candice Mclean feels her breathing is "ok". Not any worse. No complaints. Son at bedside.   RHC Findings:   RA = 13 RV = 48/15 PA = 50/26 (35) PCW = 14 Fick cardiac output/index = 5.9/2.8 PVR = 3.6 WU Ao sat = 99% PA sat = 69%, 70% RA sat = 69% SVC sat = 60%, 61% IVC sat = 60% Qp/Qs = 1.27     Objective:   Weight Range: 116.1 kg Body mass index is 46.81 kg/m.   Vital Signs:   Temp:  [97.9 F (36.6 C)-98.3 F (36.8 C)] 98 F (36.7 C) (10/03 2000) Pulse Rate:  [87-101] 87 (10/04 0400) Resp:  [19-30] 19 (10/04 0400) BP: (117-129)/(65-79) 123/79 (10/04 0400) SpO2:  [94 %-98 %] 96 % (10/04 0400) Last BM Date : 08/07/22  Weight change: Filed Weights   08/07/22 1337 08/09/22 0405 08/11/22 1233  Weight: 115.7 kg 116.1 kg 116.1 kg    Intake/Output:   Intake/Output Summary (Last 24 hours) at 08/13/2022 0829 Last data filed at 08/12/2022 1714 Gross per 24 hour  Intake --  Output 1100 ml  Net -1100 ml      Physical Exam    General:  fatigued appearing elderly AAF. No resp difficulty HEENT: Normal Neck: Supple. JVP 8-9 cm . Carotids 2+ bilat; no bruits. No lymphadenopathy or thyromegaly appreciated. Cor: PMI nondisplaced. Irregularly irregular rate & rhythm. No rubs, gallops or murmurs. Lungs: Clear Abdomen: Soft, nontender, nondistended. No hepatosplenomegaly. No bruits or masses. Good bowel sounds. Extremities: No cyanosis, clubbing, rash, edema Neuro: Alert & orientedx3, cranial nerves grossly intact. moves all 4 extremities w/o  difficulty. Affect pleasant   Telemetry   Atrial fibrillation low 100s   EKG    No new EKG to review   Labs    CBC Recent Labs    08/12/22 0414 08/12/22 1111 08/12/22 1136 08/13/22 0501  WBC 8.7  --   --  8.9  HGB 11.7*   < > 12.6 11.2*  HCT 33.5*   < > 37.0 32.6*  MCV 78.8*  --   --  79.7*  PLT 279  --   --  298   < > = values in this interval not displayed.   Basic Metabolic Panel Recent Labs    08/10/22 1001 08/11/22 0148 08/12/22 0414 08/12/22 1111 08/12/22 1136 08/13/22 0501 08/13/22 0706  NA 136   < > 134*   < > 136 133*  --   K 3.7   < > 4.4   < > 4.3 5.4* 4.1  CL 98   < > 97*  --   --  94*  --   CO2 27   < > 27  --   --  28  --   GLUCOSE 147*   < > 161*  --   --  152*  --   BUN 20   < > 22  --   --  29*  --   CREATININE 1.05*   < > 1.10*  --   --  1.14*  --   CALCIUM 9.3   < > 9.2  --   --  9.5  --   MG 1.9  --   --   --   --   --   --    < > = values in this interval not displayed.   Liver Function Tests No results for input(s): "AST", "ALT", "ALKPHOS", "BILITOT", "PROT", "ALBUMIN" in the last 72 hours. No results for input(s): "LIPASE", "AMYLASE" in the last 72 hours. Cardiac Enzymes No results for input(s): "CKTOTAL", "CKMB", "CKMBINDEX", "TROPONINI" in the last 72 hours.  BNP: BNP (last 3 results) Recent Labs    08/07/22 1008  BNP 136.0*    ProBNP (last 3 results) No results for input(s): "PROBNP" in the last 8760 hours.   D-Dimer No results for input(s): "DDIMER" in the last 72 hours. Hemoglobin A1C No results for input(s): "HGBA1C" in the last 72 hours. Fasting Lipid Panel Recent Labs    08/13/22 0501  CHOL 118  HDL 36*  LDLCALC 68  TRIG 72  CHOLHDL 3.3   Thyroid Function Tests No results for input(s): "TSH", "T4TOTAL", "T3FREE", "THYROIDAB" in the last 72 hours.  Invalid input(s): "FREET3"  Other results:   Imaging    CARDIAC CATHETERIZATION  Result Date: 08/12/2022 Findings: RA = 13 RV = 48/15 PA = 50/26 (35)  PCW = 14 Fick cardiac output/index = 5.9/2.8 PVR = 3.6 WU Ao sat = 99% PA sat = 69%, 70% RA sat = 69% SVC sat = 60%, 61% IVC sat = 60% Qp/Qs = 1.27 Assessment: 1. Mild to moderate PAH with normal cardiac output and left-sided filling pressures 2. 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 Will need CPAP/BIPAP on d/c.  Continue diuresis one more day. Will check echo with bubble study. [name], MD 11:56 AM    Medications:     Scheduled Medications:  azithromycin  250 mg Oral Daily   diclofenac Sodium  2 g Topical QID   furosemide  60 mg Intravenous Q8H   gabapentin  100 mg Oral TID   insulin aspart  0-9 Units Subcutaneous TID WC   metoprolol tartrate  25 mg Oral BID   pantoprazole  40 mg Oral BID   polyethylene glycol  17 g Oral Daily   senna-docusate  1 tablet Oral Daily    Infusions:  ceFEPime (MAXIPIME) IV 2 g (08/13/22 0147)   heparin 1,400 Units/hr (08/12/22 1447)    PRN Medications: acetaminophen, ipratropium-albuterol, lip balm, phenol, sodium chloride    Patient Profile   Candice Mclean is a 76 y.o AAF with PMH of HTN, DM, Afib on warfarin, syncope and pauses s/p MDT PPM, HLD, HFpEF, and PE 17'. AHF team asked to see for New pulmonary HTN.     Assessment/Plan   New Pulmonary Hypertension - ECHO on admission revealed severe pulm HTN, RV mildly enlarged, PASP est 68.6, severe TR - RHC demonstrated mild-mod PAH w/ nl CO and left sided filling pressures and evidence of probable small intracardiac shunt - TTE bubble study ordered. 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 Will need CPAP/BIPAP on d/c.    Chronic diastolic heart failure - ECHO 9/28: EF 55-60%. RV mildly enlarged, LA/RA mod dilated, severely elevated PASP est 68.6, no MR, severe TR - Presented NYHA IV on admission - admission CXR showed: New infiltrates in the right lower lung favored to  represent  edema, Cardiomegaly and pulmonary vascular congestion, now improved - CT chest (-) PE, Morphologic changes in keeping with PAH and at least some degree of right heart failure - RHC demonstrated mild-mod PAH w/ nl CO and left sided filling pressures - remains mildly fluid overloaded. Give 1 more dose of IV Lasix today. 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  - Rate controlled on exam  - On Warfarin at home - Continue heparin gtt 2/2 not being able to keep medications down. INR goal 2, 1.5 today - Continue metoprolol 26m BID - needs OP sleep study - [facility]   HLD - lipids good, LDL 68, TG 72    7. Hypokalemia - resolved - 4.1 today   8. N/V - now resolved, GI saw IP   9. DM2 - Hgb A1c 6.7 - per primary   Length of Stay: [address] PA-C  08/13/2022, 8:29 AM  Advanced Heart Failure Team Pager [phone](M-F; 7a - 5p)  Please contact [facility] for night-coverage after hours (5p -7a ) and weekends on [email or web address]  Patient seen and examined with the above-signed Advanced Practice Provider and/or Housestaff. I personally reviewed laboratory data, imaging studies and relevant notes. I independently examined the patient and formulated the important aspects of the plan. I have edited the note to reflect any of my changes or salient points. I have personally discussed the plan with the patient and/or family.  Patient remains very weak. Unable to stand. Edema has improved. Denies SOB.   General:  Elderly  obese woman weak appearing. No resp difficulty HEENT: normal Neck: supple. no JVD. Carotids 2+ bilat; no bruits. No lymphadenopathy or thryomegaly appreciated. Cor: PMI nondisplaced. Irregular rate & rhythm.2/6 TR Lungs: clear Abdomen: soft, nontender, nondistended. No hepatosplenomegaly. No bruits or masses. Good bowel sounds. Extremities: no cyanosis, clubbing, rash, edema Neuro: alert & orientedx3, cranial nerves grossly intact. moves all 4 extremities w/o difficulty. Affect pleasant  Volume status much improved. Cath with only mild to moderate PAH but suggestion of possible L-> R shunt with step-up.   Will stop IV lasix today. Plan TEE tomorrow to assess for shunt and also to evaluate possibility of TV tethering around PPM lead.   D/w Pulmonary. ABG without hypercapnia so doesn't qualify for home Bipap without sleep study. Would consider overnight oximetry to assess need for nocturnal O2.   Suspect Candice Mclean will need SNF.   DGlori Bickers MD  7:13 PM

## 2022-08-13 NOTE — Progress Notes (Signed)
Daily Progress Note Intern Pager: [phone]  Patient name: Candice Mclean Medical record number: [id number] Date of birth: Jul 19, 1946 Age: 76 y.o. Gender: female  Primary Care Provider: Janene Madeira, Vernice Jefferson, MD Consultants: Cardiology, respiratory therapy, pulmonology, PT/OT, heart failure Code Status: Full  Pt Overview and Major Events to Date:  9/28: Admitted to [facility] 9/30: Rapid response hypoxia, transition to BiPAP, chest x-ray bilateral opacities, ABG 7.44 pH 10/10: Patient became febrile, repeated urine and blood cultures, switch ceftriaxone to cefepime 10/3: Heart cath showed mild to moderate PAH WHO class III, CXR showed improvement of opacities in left lower lung field.  Assessment and Plan: [place] present is a 29 female who presents with shortness of breath.  She continues to improve.  Shortness of breath differential includes viral v. bacterial pneumonia (treated), pulmonary hypertension WHO class III, and CHF.  Suspect OSA, to be followed up outpatient.  Pertinent PMH/PSH includes first-degree AVB, thyroid nodule, asthma, renal artery embolism, syncope, PAF, SSS status post Medtronic dual-chamber pacemaker.  * Acute respiratory failure with hypoxia (HCC) Patient on 5 L of high flow nasal cannula, respiratory therapies continue to wean as tolerated.  Patient reports improvement of breathing.  Left lower lung opacities continue to improve, seen on CXR.  Complete antibiotic therapy and continue to wean oxygen as tolerated.  - Cefepime (9/28-10/4), azithromycin (Last dose on 10/5) - BiPAP at night (patient tolerating well) - As needed DuoNebs every 4 for wheezing - Incentive spirometry, pulmonary hygiene - CCM following, we appreciate recs - Monitor oxygen saturation, wean as tolerated - Pending strep/Legionella urine ag  Pulmonary hypertension, primary ([place]) Heart cath yesterday showed mild to moderate PAH WHO class III.  Patient received 1 more day of Lasix per  cardiology recommendations.  TEE per heart failure tomorrow. -Follow-up with outpatient for OSA -CPAP/BiPAP at discharge -Appreciate continued cardiology recs   A-fib ([place]) Irregular rate and rhythm on exam today.  Patient on heparin, we can consider going back to warfarin after TEE. -Consider switching to warfarin after TEE -Home metoprolol 47m BID   CHF (congestive heart failure) ([place] down 6 L.  Potassium was elevated to 5.4 this morning, spironolactone was held.  Recheck showed potassium of 4.1. TEE scheduled for tomorrow. -Restart spironolactone 25 mg daily - 1 dose IV Lasix today per heart failure, transition to p.o. Lasix 10/5   Extremity pain Still experiencing low back and bilateral leg pain.  Some improvement with gabapentin, can consider going up on medication. - 200 mg gabapentin 3 times daily  Diabetes ([place] Blood glucose levels remain within goal. -sSSI -CBG q6  Abdominal pain No bowel movement since admission.  Patient is not ambulating, and still has poor oral intake.  We will double up on MiraLAX senna. -Miralax  -Senna  Vomiting-resolved as of 08/13/2022     FEN/GI: Regular PPx: Heparin Dispo:SNF pending clinical improvement .   Subjective:  Patient reports that she is feeling better today, still having leg pain.  Would like to go up on gabapentin, denies side effects of gabapentin.  Objective: Temp:  [97.8 F (36.6 C)-98.3 F (36.8 C)] 97.8 F (36.6 C) (10/04 1200) Pulse Rate:  [87-101] 94 (10/04 1200) Resp:  [19-30] 20 (10/04 1200) BP: (116-133)/(65-82) 116/82 (10/04 1200) SpO2:  [93 %-97 %] 93 % (10/04 1200) Physical Exam: General: Not in acute distress, pleasant Cardiovascular: Irregular rate and rhythm Respiratory: Coarse breath sounds bilaterally, no wheezing.  Normal work of breathing on 5 L HFNC. Extremities: No edema,  warm and dry  Laboratory: Most recent CBC Lab Results  Component Value Date   WBC 8.9 08/13/2022   HGB  11.2 (L) 08/13/2022   HCT 32.6 (L) 08/13/2022   MCV 79.7 (L) 08/13/2022   PLT 298 08/13/2022   Most recent BMP    Latest Ref Rng & Units 08/13/2022    7:06 AM  BMP  Potassium 3.5 - 5.1 mmol/L 4.1     Other pertinent labs  Urine culture: Positive for yeast  Imaging/Diagnostic Tests: 10/3 CXR: Improved interstitial opacities in left lower lung field, stable right infrahilar hazy opacity.  Cardiomegaly present without overt edema.   [name], DO 08/13/2022, 12:36 PM  PGY-1, [place] Intern pager: [phone], text pages welcome Secure chat group [place]

## 2022-08-13 NOTE — Progress Notes (Signed)
ANTICOAGULATION CONSULT NOTE  Pharmacy Consult for heparin  Indication: atrial fibrillation  Allergies  Allergen Reactions   Versed [Midazolam] Hives   Tylenol With Codeine #3 [Acetaminophen-Codeine] Palpitations    Patient Measurements: Height: _0  (157.5 cm) Weight: 116.1 kg (255 lb 15.3 oz) IBW/kg (Calculated) : 50.1 Heparin Dosing Weight: 78.7kg   Vital Signs: Temp: 98 F (36.7 C) (10/04 0800) Temp Source: Oral (10/04 0800) BP: 133/79 (10/04 0800) Pulse Rate: 99 (10/04 0800)  Labs: Recent Labs    08/11/22 0148 08/11/22 1042 08/11/22 1611 08/12/22 0414 08/12/22 1111 08/12/22 1130 08/12/22 1136 08/13/22 0501  HGB 12.2  --   --  11.7*   < > 13.3 12.6 11.2*  HCT 34.6*  --   --  33.5*   < > 39.0 37.0 32.6*  PLT 244  --   --  279  --   --   --  298  LABPROT  --  20.1*  --  Candice.0*  --   --   --  18.2*  INR  --  1.7*  --  1.6*  --   --   --  1.5*  HEPARINUNFRC 0.27* 0.39 0.52 0.48  --   --   --  0.38  CREATININE 1.09*  --  1.15* 1.10*  --   --   --  1.14*   < > = values in this interval not displayed.     Estimated Creatinine Clearance: 53.1 mL/min (A) (by C-G formula based on SCr of 1.14 mg/dL (H)).   Medical History: Past Medical History:  Diagnosis Date   AF (atrial fibrillation) (HCC)    CHF (congestive heart failure) (HCC)    Coronary artery disease    Diabetes mellitus without complication (HCC)    Hypertension    Medtronic pacemaker 2015    Medications:  Scheduled:   azithromycin  250 mg Oral Daily   diclofenac Sodium  2 g Topical QID   gabapentin  100 mg Oral TID   insulin aspart  0-9 Units Subcutaneous TID WC   metoprolol tartrate  25 mg Oral BID   pantoprazole  40 mg Oral BID   polyethylene glycol  17 g Oral BID   senna-docusate  1 tablet Oral BID   spironolactone  25 mg Oral Daily   Infusions:   ceFEPime (MAXIPIME) IV 2 g (08/13/22 0901)   heparin 1,400 Units/hr (08/12/22 1447)    Assessment: Candice Mclean presenting 9/28 with  sudden onset dysphagia, associated vomiting, and subsequently developed SOB. Patient has a history of afib with PTA warfarin regimen of 7.5 mg on Sun/Mon/Thurs and 5 mg on all other days--no doses since admission. Patient's INR was 2.5 on admission and heparin was started 10/1 after INR < 2.   Heparin level today remains therapeutic at 0.38, on 1400 units/hr. Hgb 11.2, plt 298--stable. No line issues or signs/symptoms of bleeding reported.    Goal of Therapy:  Heparin level 0.3-0.7 units/ml Monitor platelets by anticoagulation protocol: Yes   Plan:  Continue heparin 1400 units/hr Monitor heparin level, CBC, and s/sx of bleeding daily  F/u transition to warfarin after TEE 10/5  Billey Gosling, PharmD PGY1 Pharmacy Resident 10/4/202312:16 PM

## 2022-08-13 NOTE — Progress Notes (Signed)
  Echocardiogram 2D Echocardiogram has been performed.  Candice Mclean 08/13/2022, 9:47 AM

## 2022-08-13 NOTE — Progress Notes (Deleted)
2D echo attempted, but patient in MRI. Will try later

## 2022-08-13 NOTE — H&P (View-Only) (Signed)
  Advanced Heart Failure Rounding Note  PCP-Cardiologist: Mihai Croitoru, MD   Subjective:    RHC 10/3 demonstrated mild-mod PAH w/ nl CO and left sided filling pressures and evidence of probable small intracardiac shunt  Continued on IV Lasix post cath. At least 1.1L in UOP charted yesterday (? I/Os complete). Today's wt not charted.   SCr stable. Initial K 5.4 due to Hemolysis. Repeat K 4.1    Pending Echo bubble and TEE.   She feels her breathing is "ok". Not any worse. No complaints. Son at bedside.   RHC Findings:   RA = 13 RV = 48/15 PA = 50/26 (35) PCW = 14 Fick cardiac output/index = 5.9/2.8 PVR = 3.6 WU Ao sat = 99% PA sat = 69%, 70% RA sat = 69% SVC sat = 60%, 61% IVC sat = 60% Qp/Qs = 1.27     Objective:   Weight Range: 116.1 kg Body mass index is 46.81 kg/m.   Vital Signs:   Temp:  [97.9 F (36.6 C)-98.3 F (36.8 C)] 98 F (36.7 C) (10/03 2000) Pulse Rate:  [87-101] 87 (10/04 0400) Resp:  [19-30] 19 (10/04 0400) BP: (117-129)/(65-79) 123/79 (10/04 0400) SpO2:  [94 %-98 %] 96 % (10/04 0400) Last BM Date : 08/07/22  Weight change: Filed Weights   08/07/22 1337 08/09/22 0405 08/11/22 1233  Weight: 115.7 kg 116.1 kg 116.1 kg    Intake/Output:   Intake/Output Summary (Last 24 hours) at 08/13/2022 0829 Last data filed at 08/12/2022 1714 Gross per 24 hour  Intake --  Output 1100 ml  Net -1100 ml      Physical Exam    General:  fatigued appearing elderly AAF. No resp difficulty HEENT: Normal Neck: Supple. JVP 8-9 cm . Carotids 2+ bilat; no bruits. No lymphadenopathy or thyromegaly appreciated. Cor: PMI nondisplaced. Irregularly irregular rate & rhythm. No rubs, gallops or murmurs. Lungs: Clear Abdomen: Soft, nontender, nondistended. No hepatosplenomegaly. No bruits or masses. Good bowel sounds. Extremities: No cyanosis, clubbing, rash, edema Neuro: Alert & orientedx3, cranial nerves grossly intact. moves all 4 extremities w/o  difficulty. Affect pleasant   Telemetry   Atrial fibrillation low 100s   EKG    No new EKG to review   Labs    CBC Recent Labs    08/12/22 0414 08/12/22 1111 08/12/22 1136 08/13/22 0501  WBC 8.7  --   --  8.9  HGB 11.7*   < > 12.6 11.2*  HCT 33.5*   < > 37.0 32.6*  MCV 78.8*  --   --  79.7*  PLT 279  --   --  298   < > = values in this interval not displayed.   Basic Metabolic Panel Recent Labs    08/10/22 1001 08/11/22 0148 08/12/22 0414 08/12/22 1111 08/12/22 1136 08/13/22 0501 08/13/22 0706  NA 136   < > 134*   < > 136 133*  --   K 3.7   < > 4.4   < > 4.3 5.4* 4.1  CL 98   < > 97*  --   --  94*  --   CO2 27   < > 27  --   --  28  --   GLUCOSE 147*   < > 161*  --   --  152*  --   BUN 20   < > 22  --   --  29*  --   CREATININE 1.05*   < > 1.10*  --   --    1.14*  --   CALCIUM 9.3   < > 9.2  --   --  9.5  --   MG 1.9  --   --   --   --   --   --    < > = values in this interval not displayed.   Liver Function Tests No results for input(s): "AST", "ALT", "ALKPHOS", "BILITOT", "PROT", "ALBUMIN" in the last 72 hours. No results for input(s): "LIPASE", "AMYLASE" in the last 72 hours. Cardiac Enzymes No results for input(s): "CKTOTAL", "CKMB", "CKMBINDEX", "TROPONINI" in the last 72 hours.  BNP: BNP (last 3 results) Recent Labs    08/07/22 1008  BNP 136.0*    ProBNP (last 3 results) No results for input(s): "PROBNP" in the last 8760 hours.   D-Dimer No results for input(s): "DDIMER" in the last 72 hours. Hemoglobin A1C No results for input(s): "HGBA1C" in the last 72 hours. Fasting Lipid Panel Recent Labs    08/13/22 0501  CHOL 118  HDL 36*  LDLCALC 68  TRIG 72  CHOLHDL 3.3   Thyroid Function Tests No results for input(s): "TSH", "T4TOTAL", "T3FREE", "THYROIDAB" in the last 72 hours.  Invalid input(s): "FREET3"  Other results:   Imaging    CARDIAC CATHETERIZATION  Result Date: 08/12/2022 Findings: RA = 13 RV = 48/15 PA = 50/26 (35)  PCW = 14 Fick cardiac output/index = 5.9/2.8 PVR = 3.6 WU Ao sat = 99% PA sat = 69%, 70% RA sat = 69% SVC sat = 60%, 61% IVC sat = 60% Qp/Qs = 1.27 Assessment: 1. Mild to moderate PAH with normal cardiac output and left-sided filling pressures 2. 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 Will need CPAP/BIPAP on d/c.  Continue diuresis one more day. Will check echo with bubble study. Candice Neiswender, MD 11:56 AM    Medications:     Scheduled Medications:  azithromycin  250 mg Oral Daily   diclofenac Sodium  2 g Topical QID   furosemide  60 mg Intravenous Q8H   gabapentin  100 mg Oral TID   insulin aspart  0-9 Units Subcutaneous TID WC   metoprolol tartrate  25 mg Oral BID   pantoprazole  40 mg Oral BID   polyethylene glycol  17 g Oral Daily   senna-docusate  1 tablet Oral Daily    Infusions:  ceFEPime (MAXIPIME) IV 2 g (08/13/22 0147)   heparin 1,400 Units/hr (08/12/22 1447)    PRN Medications: acetaminophen, ipratropium-albuterol, lip balm, phenol, sodium chloride    Patient Profile   Candice Mclean is a 76 y.o AAF with PMH of HTN, DM, Afib on warfarin, syncope and pauses s/p MDT PPM, HLD, HFpEF, and PE 17'. AHF team asked to see for New pulmonary HTN.     Assessment/Plan   New Pulmonary Hypertension - ECHO on admission revealed severe pulm HTN, RV mildly enlarged, PASP est 68.6, severe TR - RHC demonstrated mild-mod PAH w/ nl CO and left sided filling pressures and evidence of probable small intracardiac shunt - TTE bubble study ordered. 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 Will need CPAP/BIPAP on d/c.    Chronic diastolic heart failure - ECHO 9/28: EF 55-60%. RV mildly enlarged, LA/RA mod dilated, severely elevated PASP est 68.6, no MR, severe TR - Presented NYHA IV on admission - admission CXR showed: New infiltrates in the right lower lung favored to  represent   edema, Cardiomegaly and pulmonary vascular congestion, now improved - CT chest (-) PE, Morphologic changes in keeping with PAH and at least some degree of right heart failure - RHC demonstrated mild-mod PAH w/ nl CO and left sided filling pressures - remains mildly fluid overloaded. Give 1 more dose of IV Lasix today. 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  - Rate controlled on exam  - On Warfarin at home - Continue heparin gtt 2/2 not being able to keep medications down. INR goal 2, 1.5 today - Continue metoprolol 25mg BID - needs OP sleep study - Follows w/ Dr. Shah w/ [facility]   HLD - lipids good, LDL 68, TG 72    7. Hypokalemia - resolved - 4.1 today   8. N/V - now resolved, GI saw IP   9. DM2 - Hgb A1c 6.7 - per primary   Length of Stay: 5  Candice Simmons, PA-C  08/13/2022, 8:29 AM  Advanced Heart Failure Team Pager [phone] (M-F; 7a - 5p)  Please contact [facility] for night-coverage after hours (5p -7a ) and weekends on [email or web address]  Patient seen and examined with the above-signed Advanced Practice Provider and/or Housestaff. I personally reviewed laboratory data, imaging studies and relevant notes. I independently examined the patient and formulated the important aspects of the plan. I have edited the note to reflect any of my changes or salient points. I have personally discussed the plan with the patient and/or family.  Patient remains very weak. Unable to stand. Edema has improved. Denies SOB.   General:  Elderly  obese woman weak appearing. No resp difficulty HEENT: normal Neck: supple. no JVD. Carotids 2+ bilat; no bruits. No lymphadenopathy or thryomegaly appreciated. Cor: PMI nondisplaced. Irregular rate & rhythm.2/6 TR Lungs: clear Abdomen: soft, nontender, nondistended. No hepatosplenomegaly. No bruits or masses. Good bowel sounds. Extremities: no cyanosis, clubbing, rash, edema Neuro: alert & orientedx3, cranial nerves grossly intact. moves all 4 extremities w/o difficulty. Affect pleasant  Volume status much improved. Cath with only mild to moderate PAH but suggestion of possible L-> R shunt with step-up.   Will stop IV lasix today. Plan TEE tomorrow to assess for shunt and also to evaluate possibility of TV tethering around PPM lead.   D/w Pulmonary. ABG without hypercapnia so doesn't qualify for home Bipap without sleep study. Would consider overnight oximetry to assess need for nocturnal O2.   Suspect she will need SNF.   Candice Kawa, MD  7:13 PM     

## 2022-08-13 NOTE — Progress Notes (Signed)
Pt requested to come off Bipap at this time. Pt taken off BiPAP and placed on 5L HFNC, Sp02 96%.

## 2022-08-13 NOTE — Progress Notes (Signed)
Physical Therapy Treatment Patient Details Name: Candice Mclean MRN: [id number] DOB: April 11, 1946 Today's Date: 08/13/2022   History of Present Illness 76 y.o. female presenting with sudden onset dysphagia with associated vomiting and subsequently developed SOB. s/p RHC on 10/3. 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    PT Comments    Pt with flat affect, but motivated to attempt EOB and standing. Pt requiring significant physical assist for bed mobility and transfer into stand x2, pt fatiguing quickly. Pt also with heavy R lateral leaning during EOB sit x10 minutes, corrected physically by PT intermittently for duration of sitting. VSS, cardiology in room during session. PT to continue to follow acutely.      Recommendations for follow up therapy are one component of a multi-disciplinary discharge planning process, led by the attending physician.  Recommendations may be updated based on patient status, additional functional criteria and insurance authorization.  Follow Up Recommendations  Skilled nursing-short term rehab (<3 hours/day) Can patient physically be transported by private vehicle: No   Assistance Recommended at Discharge Frequent or constant Supervision/Assistance  Patient can return home with the following A lot of help with walking and/or transfers;A little help with bathing/dressing/bathroom;Assistance with cooking/housework;Direct supervision/assist for medications management;Direct supervision/assist for financial management;Assist for transportation;Help with stairs or ramp for entrance   Equipment Recommendations  None recommended by PT (TBD)    Recommendations for Other Services       Precautions / Restrictions Precautions Precautions: Fall Precaution  Comments: s/p RHC with RUE precs (handout given to reinforce no bending first 24 hours); watch O2 sats and HR; Droplet precs Restrictions Weight Bearing Restrictions: No Other Position/Activity Restrictions: s/p RHC 10/3     Mobility  Bed Mobility Overal bed mobility: Needs Assistance Bed Mobility: Supine to Sit, Sit to Supine     Supine to sit: Max assist Sit to supine: Max assist   General bed mobility comments: assist for trunk and LE management, +2 for boost up in bed    Transfers Overall transfer level: Needs assistance Equipment used: Rolling walker (2 wheels) Transfers: Sit to/from Stand Sit to Stand: Mod assist, +2 physical assistance, +2 safety/equipment           General transfer comment: mod +2 for power up, rise, hip extension to upright. STS x2 from EOB    Ambulation/Gait               General Gait Details: nt   Marine scientist Rankin (Stroke Patients Only)       Balance Overall balance assessment: Needs assistance Sitting-balance support: Feet supported Sitting balance-Leahy Scale: Poor Sitting balance - Comments: heavy R lateral leaning initially, intermittently requiring PT assist to correct Postural control: Posterior lean Standing balance support: Bilateral upper extremity supported, During functional activity Standing balance-Leahy Scale: Zero Standing balance comment: max assist +1                            Cognition Arousal/Alertness: Awake/alert Behavior During Therapy: Flat affect Overall Cognitive Status: Impaired/Different from baseline Area of Impairment: Memory, Following commands, Problem solving  Memory: Decreased recall of precautions Following Commands: Follows one step commands with increased time     Problem Solving: Slow processing, Decreased initiation, Difficulty sequencing, Requires verbal cues General Comments: requires  one-step cues and increased time        Exercises      General Comments General comments (skin integrity, edema, etc.): VSS on 5LO2, HRmax observed 125 bpm. Pt needing full linen change as pt soiled in urine      Pertinent Vitals/Pain Pain Assessment Pain Assessment: Faces Faces Pain Scale: Hurts even more Pain Location: "all over" Pain Descriptors / Indicators: Guarding, Grimacing, Discomfort, Sore Pain Intervention(s): Repositioned, Limited activity within patient's tolerance, Monitored during session    Home Living                          Prior Function            PT Goals (current goals can now be found in the care plan section) Acute Rehab PT Goals Patient Stated Goal: to feel better PT Goal Formulation: With family Time For Goal Achievement: 08/22/22 Potential to Achieve Goals: Fair Progress towards PT goals: Progressing toward goals    Frequency    Min 3X/week      PT Plan Current plan remains appropriate    Co-evaluation              AM-PAC PT "6 Clicks" Mobility   Outcome Measure  Help needed turning from your back to your side while in a flat bed without using bedrails?: A Lot Help needed moving from lying on your back to sitting on the side of a flat bed without using bedrails?: A Lot Help needed moving to and from a bed to a chair (including a wheelchair)?: Total Help needed standing up from a chair using your arms (e.g., wheelchair or bedside chair)?: Total Help needed to walk in hospital room?: Total Help needed climbing 3-5 steps with a railing? : Total 6 Click Score: 8    End of Session Equipment Utilized During Treatment: Oxygen Activity Tolerance: Patient limited by lethargy Patient left: in bed;with call bell/phone within reach;with family/visitor present;with bed alarm set;Other (comment) (neices and sisters present, very helpful and supportive) Nurse Communication: Mobility status PT Visit Diagnosis: Unsteadiness on  feet (R26.81);Other abnormalities of gait and mobility (R26.89);Muscle weakness (generalized) (M62.81);Difficulty in walking, not elsewhere classified (R26.2)     Time: 7035-0093 PT Time Calculation (min) (ACUTE ONLY): 43 min  Charges:  $Therapeutic Activity: 23-37 mins $Neuromuscular Re-education: 8-22 mins                     Stacie Glaze, PT DPT Acute Rehabilitation Services Pager [phone]  Office [phone]    Roxine Caddy E Ruffin Pyo 08/13/2022, 4:24 PM

## 2022-08-14 ENCOUNTER — Inpatient Hospital Stay (HOSPITAL_COMMUNITY): Payer: Medicare Other | Admitting: Anesthesiology

## 2022-08-14 ENCOUNTER — Inpatient Hospital Stay (HOSPITAL_COMMUNITY): Payer: Medicare Other

## 2022-08-14 ENCOUNTER — Encounter (HOSPITAL_COMMUNITY)
Admission: EM | Disposition: A | Discharge status: Skilled Nursing Facility | Payer: Self-pay | Source: Home / Self Care | Attending: Family Medicine

## 2022-08-14 DIAGNOSIS — I482 Chronic atrial fibrillation, unspecified: Secondary | ICD-10-CM | POA: Diagnosis not present

## 2022-08-14 DIAGNOSIS — I361 Nonrheumatic tricuspid (valve) insufficiency: Secondary | ICD-10-CM

## 2022-08-14 DIAGNOSIS — I2510 Atherosclerotic heart disease of native coronary artery without angina pectoris: Secondary | ICD-10-CM

## 2022-08-14 DIAGNOSIS — J9601 Acute respiratory failure with hypoxia: Secondary | ICD-10-CM | POA: Diagnosis not present

## 2022-08-14 DIAGNOSIS — I4891 Unspecified atrial fibrillation: Secondary | ICD-10-CM | POA: Diagnosis not present

## 2022-08-14 DIAGNOSIS — J189 Pneumonia, unspecified organism: Secondary | ICD-10-CM

## 2022-08-14 DIAGNOSIS — I110 Hypertensive heart disease with heart failure: Secondary | ICD-10-CM

## 2022-08-14 DIAGNOSIS — I272 Pulmonary hypertension, unspecified: Secondary | ICD-10-CM

## 2022-08-14 DIAGNOSIS — I509 Heart failure, unspecified: Secondary | ICD-10-CM

## 2022-08-14 DIAGNOSIS — Q2112 Patent foramen ovale: Secondary | ICD-10-CM

## 2022-08-14 HISTORY — PX: TEE WITHOUT CARDIOVERSION: SHX5443

## 2022-08-14 LAB — PROTIME-INR
INR: 1.6 — ABNORMAL HIGH (ref 0.8–1.2)
Prothrombin Time: 19.2 seconds — ABNORMAL HIGH (ref 11.4–15.2)

## 2022-08-14 LAB — PULMONARY FUNCTION TEST
FEF 25-75 Pre: 0.77 L/sec
FEF2575-%Pred-Pre: 47 %
FEV1-%Pred-Pre: 34 %
FEV1-Pre: 0.68 L
FEV1FVC-%Pred-Pre: 115 %
FEV6-%Pred-Pre: 30 %
FEV6-Pre: 0.78 L
FEV6FVC-%Pred-Pre: 105 %
FVC-%Pred-Pre: 29 %
FVC-Pre: 0.78 L
Pre FEV1/FVC ratio: 87 %
Pre FEV6/FVC Ratio: 100 %

## 2022-08-14 LAB — BASIC METABOLIC PANEL
Anion gap: 11 (ref 5–15)
BUN: 29 mg/dL — ABNORMAL HIGH (ref 8–23)
CO2: 26 mmol/L (ref 22–32)
Calcium: 9.2 mg/dL (ref 8.9–10.3)
Chloride: 93 mmol/L — ABNORMAL LOW (ref 98–111)
Creatinine, Ser: 1.13 mg/dL — ABNORMAL HIGH (ref 0.44–1.00)
GFR, Estimated: 51 mL/min — ABNORMAL LOW (ref >60)
Glucose, Bld: 152 mg/dL — ABNORMAL HIGH (ref 70–99)
Potassium: 3.8 mmol/L (ref 3.5–5.1)
Sodium: 130 mmol/L — ABNORMAL LOW (ref 135–145)

## 2022-08-14 LAB — CBC
HCT: 31.7 % — ABNORMAL LOW (ref 36.0–46.0)
Hemoglobin: 10.9 g/dL — ABNORMAL LOW (ref 12.0–15.0)
MCH: 27.7 pg (ref 26.0–34.0)
MCHC: 34.4 g/dL (ref 30.0–36.0)
MCV: 80.5 fL (ref 80.0–100.0)
Platelets: 325 10*3/uL (ref 150–400)
RBC: 3.94 MIL/uL (ref 3.87–5.11)
RDW: 18.6 % — ABNORMAL HIGH (ref 11.5–15.5)
WBC: 9 10*3/uL (ref 4.0–10.5)
nRBC: 0 % (ref 0.0–0.2)

## 2022-08-14 LAB — GLUCOSE, CAPILLARY
Glucose-Capillary: 146 mg/dL — ABNORMAL HIGH (ref 70–99)
Glucose-Capillary: 185 mg/dL — ABNORMAL HIGH (ref 70–99)
Glucose-Capillary: 173 mg/dL — ABNORMAL HIGH (ref 70–99)
Glucose-Capillary: 189 mg/dL — ABNORMAL HIGH (ref 70–99)

## 2022-08-14 LAB — HEPARIN LEVEL (UNFRACTIONATED): Heparin Unfractionated: 0.5 IU/mL (ref 0.30–0.70)

## 2022-08-14 SURGERY — ECHOCARDIOGRAM, TRANSESOPHAGEAL
Anesthesia: Monitor Anesthesia Care

## 2022-08-14 MED ORDER — LIDOCAINE 2% (20 MG/ML) 5 ML SYRINGE
INTRAMUSCULAR | Status: DC | PRN
  Administered 2022-08-14: 40 mg via INTRAVENOUS

## 2022-08-14 MED ORDER — PROPOFOL 10 MG/ML IV BOLUS
INTRAVENOUS | Status: DC | PRN
  Administered 2022-08-14 (×2): 20 mg via INTRAVENOUS

## 2022-08-14 MED ORDER — SODIUM CHLORIDE 0.45 % IV SOLN: INTRAVENOUS | Status: DC

## 2022-08-14 MED ORDER — ETOMIDATE 2 MG/ML IV SOLN
INTRAVENOUS | Status: DC | PRN
  Administered 2022-08-14: 5 mg via INTRAVENOUS
  Administered 2022-08-14: 2 mg via INTRAVENOUS
  Administered 2022-08-14: 5 mg via INTRAVENOUS

## 2022-08-14 MED ORDER — SODIUM CHLORIDE 0.9 % IV SOLN: INTRAVENOUS | Status: DC | PRN

## 2022-08-14 MED ORDER — TORSEMIDE 20 MG PO TABS
20.0000 mg | ORAL_TABLET | Freq: Every day | ORAL | Status: DC
  Administered 2022-08-15 – 2022-08-18 (×4): 20 mg via ORAL
  Filled 2022-08-14 (×4): qty 1

## 2022-08-14 NOTE — CV Procedure (Signed)
    TRANSESOPHAGEAL ECHOCARDIOGRAM   NAME:  Candice Mclean   MRN: [id number] DOB:  1946-10-22   ADMIT DATE: 08/06/2022  INDICATIONS:  Severe TR  PROCEDURE:   Informed consent was obtained prior to the procedure. The risks, benefits and alternatives for the procedure were discussed and the patient comprehended these risks.  Risks include, but are not limited to, cough, sore throat, vomiting, nausea, somnolence, esophageal and stomach trauma or perforation, bleeding, low blood pressure, aspiration, pneumonia, infection, trauma to the teeth and death.    After a procedural time-out, the patient was sedated by the anesthesia service.The transesophageal probe was inserted in the esophagus and stomach without difficulty and multiple views were obtained.    COMPLICATIONS:    There were no immediate complications.  FINDINGS:  LEFT VENTRICLE: EF = 60-65%. No regional wall motion abnormalities.  RIGHT VENTRICLE: Mildly dilated. Mild to moderate HK  LEFT ATRIUM: Mildly dilated  LEFT ATRIAL APPENDAGE: No thrombus. + smoke  RIGHT ATRIUM: Severely dilated. + pacer wire  AORTIC VALVE:  Trileaflet. Trivial AI  MITRAL VALVE:    Normal. Trivial MR  TRICUSPID VALVE: Normal. Severe TR with likely tethering of the septal leaflet with pacer wire  PULMONIC VALVE: Grossly normal. Mild PI   INTERATRIAL SEPTUM: + atrial septal aneurysm with + PFO  PERICARDIUM: No effusion  DESCENDING AORTA: Mild plaque  Conclusion:  Follow up with cardiologist in [facility] Duffy Dantonio,MD 8:10 AM

## 2022-08-14 NOTE — Progress Notes (Signed)
OT Cancellation Note  Patient Details Name: Candice Mclean MRN: [id number] DOB: 08-14-1946   Cancelled Treatment:    Reason Eval/Treat Not Completed: Patient at procedure or test/ unavailable  [facility] 08/14/2022, 7:37 AM Maurie Boettcher, OT/L   Acute OT Clinical Specialist Acute Rehabilitation Services Pager [phone] Office [phone]

## 2022-08-14 NOTE — Progress Notes (Signed)
Telemetry informed charge nurse that she had 4 runs of vtach.Informed MD and team

## 2022-08-14 NOTE — Progress Notes (Signed)
ANTICOAGULATION CONSULT NOTE  Pharmacy Consult for heparin  Indication: atrial fibrillation  Allergies  Allergen Reactions   Versed [Midazolam] Hives   Tylenol With Codeine #3 [Acetaminophen-Codeine] Palpitations    Patient Measurements: Height: _0  (157.5 cm) Weight: 116.1 kg (255 lb 15.3 oz) IBW/kg (Calculated) : 50.1 Heparin Dosing Weight: 78.7kg   Vital Signs: Temp: 97.6 F (36.4 C) (10/05 1221) Temp Source: Axillary (10/05 1221) BP: 117/75 (10/05 1221) Pulse Rate: 88 (10/05 1221)  Labs: Recent Labs    08/12/22 0414 08/12/22 1111 08/12/22 1136 08/13/22 0501 08/14/22 0402  HGB 11.7*   < > 12.6 11.2* 10.9*  HCT 33.5*   < > 37.0 32.6* 31.7*  PLT 279  --   --  298 325  LABPROT 19.0*  --   --  18.2* 19.2*  INR 1.6*  --   --  1.5* 1.6*  HEPARINUNFRC 0.48  --   --  0.38 0.50  CREATININE 1.10*  --   --  1.14* 1.13*   < > = values in this interval not displayed.     Estimated Creatinine Clearance: 53.5 mL/min (A) (by C-G formula based on SCr of 1.13 mg/dL (H)).   Medical History: Past Medical History:  Diagnosis Date   AF (atrial fibrillation) (HCC)    CHF (congestive heart failure) (HCC)    Coronary artery disease    Diabetes mellitus without complication ([place])    Hypertension    Medtronic pacemaker 2015    Medications:  Scheduled:   diclofenac Sodium  2 g Topical QID   gabapentin  200 mg Oral TID   insulin aspart  0-9 Units Subcutaneous TID WC   metoprolol tartrate  25 mg Oral BID   pantoprazole  40 mg Oral BID   polyethylene glycol  17 g Oral BID   senna-docusate  1 tablet Oral BID   spironolactone  25 mg Oral Daily   [START ON 08/15/2022] torsemide  20 mg Oral Daily   Infusions:   heparin 1,400 Units/hr (08/12/22 1447)    Assessment: 76 YO female presenting 9/28 with sudden onset dysphagia, associated vomiting, and subsequently developed SOB. Patient has a history of afib with PTA warfarin regimen of 7.5 mg on Sun/Mon/Thurs and 5 mg on all  other days--no doses since admission. Patient's INR was 2.5 on admission and heparin was started 10/1 after INR < 2.   Heparin level today remains therapeutic at 0.50, on 1400 units/hr. Hgb 10.9, plt 325--stable. No line issues or signs/symptoms of bleeding reported.    Goal of Therapy:  Heparin level 0.3-0.7 units/ml Monitor platelets by anticoagulation protocol: Yes   Plan:  Continue heparin 1400 units/hr Monitor heparin level, CBC, and s/sx of bleeding daily  F/u plan to restart warfarin   Billey Gosling, PharmD PGY1 Pharmacy Resident 10/5/20231:55 PM

## 2022-08-14 NOTE — Progress Notes (Signed)
Discussed anticoagulation options with patient.  She currently gets warfarin at no cost.  She is agreeable to DOAC if she can receive it at little to no cost.  Additionally we discussed that we will start the process for SNF placement.  They would like to speak with social work about options, and I reassured patient and family they will be seen by social work.

## 2022-08-14 NOTE — Progress Notes (Addendum)
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  The use of the NIV will treat this patient's ventilatory defects and can reduce risk of exacerbations and future hospitalizations when used at night and during the day. All alternate devices [id number] and [id number]) have been considered and ruled out as volume requirements are not met by BiLevel devices.  An NIV with volume-targeted pressure support is necessary to prevent patient from life-threatening harm.  Interruption or failure to provide NIV would quickly lead to exacerbation of the patient's condition, hospital admission, and likely harm to the patient. Continued use is preferred.  Patient is able to protect their airways and clear secretions on their own.

## 2022-08-14 NOTE — Transfer of Care (Signed)
Immediate Anesthesia Transfer of Care Note  Patient: Candice Mclean  Procedure(s) Performed: TRANSESOPHAGEAL ECHOCARDIOGRAM (TEE)  Patient Location: Endoscopy Unit  Anesthesia Type:MAC  Level of Consciousness: awake and alert   Airway & Oxygen Therapy: Patient Spontanous Breathing and Patient connected to face mask oxygen  Post-op Assessment: Report given to RN and Post -op Vital signs reviewed and stable  Post vital signs: Reviewed and stable  Last Vitals:  Vitals Value Taken Time  BP 140/87 08/14/22 0830  Temp 36 C 08/14/22 0824  Pulse 85 08/14/22 0832  Resp 22 08/14/22 0832  SpO2 95 % 08/14/22 0832  Vitals shown include unvalidated device data.  Last Pain:  Vitals:   08/14/22 0824  TempSrc: Tympanic  PainSc: 0-No pain      Patients Stated Pain Goal: 0 (24/26/83 4196)  Complications: No notable events documented.

## 2022-08-14 NOTE — TOC CM/SW Note (Signed)
HF TOC CM received necessary documents to complete for [place] to get NIV. Message sent to attending. [place], Heart Failure TOC CM [phone]

## 2022-08-14 NOTE — Anesthesia Preprocedure Evaluation (Signed)
Anesthesia Evaluation  Patient identified by MRN, date of birth, ID band Patient awake    Reviewed: Allergy & Precautions, H&P , NPO status , Patient's Chart, lab work & pertinent test results  Airway Mallampati: II   Neck ROM: full    Dental   Pulmonary neg pulmonary ROS,    breath sounds clear to auscultation       Cardiovascular hypertension, + CAD and +CHF  + dysrhythmias Atrial Fibrillation + pacemaker  Rhythm:irregular Rate:Normal     Neuro/Psych    GI/Hepatic   Endo/Other  diabetes, Type 2  Renal/GU      Musculoskeletal   Abdominal   Peds  Hematology  (+) Blood dyscrasia, anemia ,   Anesthesia Other Findings   Reproductive/Obstetrics                             Anesthesia Physical Anesthesia Plan  ASA: 3  Anesthesia Plan: MAC   Post-op Pain Management:    Induction: Intravenous  PONV Risk Score and Plan: 2 and Propofol infusion and Treatment may vary due to age or medical condition  Airway Management Planned: Nasal Cannula  Additional Equipment:   Intra-op Plan:   Post-operative Plan:   Informed Consent: I have reviewed the patients History and Physical, chart, labs and discussed the procedure including the risks, benefits and alternatives for the proposed anesthesia with the patient or authorized representative who has indicated his/her understanding and acceptance.     Dental advisory given  Plan Discussed with: CRNA, Anesthesiologist and Surgeon  Anesthesia Plan Comments:         Anesthesia Quick Evaluation

## 2022-08-14 NOTE — Anesthesia Procedure Notes (Signed)
Procedure Name: MAC Date/Time: 08/14/2022 7:45 AM  Performed by: Lorie Phenix, CRNAPre-anesthesia Checklist: Patient identified, Emergency Drugs available, Suction available and Patient being monitored Patient Re-evaluated:Patient Re-evaluated prior to induction Oxygen Delivery Method: Nasal cannula Placement Confirmation: positive ETCO2

## 2022-08-14 NOTE — Interval H&P Note (Signed)
History and Physical Interval Note:  08/14/2022 7:47 AM  Candice Mclean  has presented today for surgery, with the diagnosis of pulmonary hypertension r/o shunt.  The various methods of treatment have been discussed with the patient and family. After consideration of risks, benefits and other options for treatment, the patient has consented to  Procedure(s): TRANSESOPHAGEAL ECHOCARDIOGRAM (TEE) (N/A) as a surgical intervention.  The patient's history has been reviewed, patient examined, no change in status, stable for surgery.  I have reviewed the patient's chart and labs.  Questions were answered to the patient's satisfaction.     Candice Mclean

## 2022-08-14 NOTE — Progress Notes (Signed)
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  Additionally we discussed if she would be cleared from heart failure perspective to discharge to SNF.  Provider said that she would circle back with Dr. Haroldine Laws, but that they were likely to sign off.  We appreciate heart failure recommendations.

## 2022-08-14 NOTE — Progress Notes (Signed)
Advanced Heart Failure Rounding Note  PCP-Cardiologist: Sanda Klein, MD   Subjective:    RHC 10/3 demonstrated mild-mod PAH w/ nl CO and left sided filling pressures and evidence of possible small intracardiac shunt. Bubble study negative.   IV lasix stopped yesterday. Very weak. Denies SOB, orthopnea or PND.   FOr TEE today  On droplet precautions for rhinovirus in resp culture     Objective:   Weight Range: 116.1 kg Body mass index is 46.81 kg/m.   Vital Signs:   Temp:  [97.8 F (36.6 C)-98.9 F (37.2 C)] 97.8 F (36.6 C) (10/05 0700) Pulse Rate:  [82-109] 82 (10/05 0700) Resp:  [15-26] 15 (10/05 0700) BP: (103-133)/(66-91) 128/76 (10/05 0700) SpO2:  [93 %-98 %] 98 % (10/05 0700) Last BM Date : 08/07/22  Weight change: Filed Weights   08/07/22 1337 08/09/22 0405 08/11/22 1233  Weight: 115.7 kg 116.1 kg 116.1 kg    Intake/Output:   Intake/Output Summary (Last 24 hours) at 08/14/2022 0747 Last data filed at 08/14/2022 0300 Gross per 24 hour  Intake --  Output 650 ml  Net -650 ml       Physical Exam    General:  Weak appearing. No resp difficulty HEENT: normal Neck: supple. no JVD. Carotids 2+ bilat; no bruits. No lymphadenopathy or thryomegaly appreciated. Cor: PMI nondisplaced. Irregular rate & rhythm. 2/6 TR Lungs: clear Abdomen: obese soft, nontender, nondistended. No hepatosplenomegaly. No bruits or masses. Good bowel sounds. Extremities: no cyanosis, clubbing, rash, edema Neuro: alert & orientedx3, cranial nerves grossly intact. moves all 4 extremities w/o difficulty. Affect pleasant   Telemetry   Atrial fibrillation l80-90s Personally reviewed  Labs    CBC Recent Labs    08/13/22 0501 08/14/22 0402  WBC 8.9 9.0  HGB 11.2* 10.9*  HCT 32.6* 31.7*  MCV 79.7* 80.5  PLT 298 121    Basic Metabolic Panel Recent Labs    08/12/22 0414 08/12/22 1111 08/12/22 1136 08/13/22 0501 08/13/22 0706  NA 134*   < > 136 133*  --   K 4.4    < > 4.3 5.4* 4.1  CL 97*  --   --  94*  --   CO2 27  --   --  28  --   GLUCOSE 161*  --   --  152*  --   BUN 22  --   --  29*  --   CREATININE 1.10*  --   --  1.14*  --   CALCIUM 9.2  --   --  9.5  --    < > = values in this interval not displayed.    Liver Function Tests No results for input(s): "AST", "ALT", "ALKPHOS", "BILITOT", "PROT", "ALBUMIN" in the last 72 hours. No results for input(s): "LIPASE", "AMYLASE" in the last 72 hours. Cardiac Enzymes No results for input(s): "CKTOTAL", "CKMB", "CKMBINDEX", "TROPONINI" in the last 72 hours.  BNP: BNP (last 3 results) Recent Labs    08/07/22 1008  BNP 136.0*     ProBNP (last 3 results) No results for input(s): "PROBNP" in the last 8760 hours.   D-Dimer No results for input(s): "DDIMER" in the last 72 hours. Hemoglobin A1C No results for input(s): "HGBA1C" in the last 72 hours. Fasting Lipid Panel Recent Labs    08/13/22 0501  CHOL 118  HDL 36*  LDLCALC 68  TRIG 72  CHOLHDL 3.3    Thyroid Function Tests No results for input(s): "TSH", "T4TOTAL", "T3FREE", "THYROIDAB" in the  last 72 hours.  Invalid input(s): "FREET3"  Other results:   Imaging    ECHOCARDIOGRAM LIMITED BUBBLE STUDY  Result Date: 08/13/2022    ECHOCARDIOGRAM LIMITED REPORT   Patient Name:   Candice Mclean Date of Exam: 08/13/2022 Medical Rec #:  [id number]       Height:       62.0 in Accession #:    [id number]      Weight:       256.0 lb Date of Birth:  1946/01/24      BSA:          2.123 m Patient Age:    76 years        BP:           123/79 mmHg Patient Gender: F               HR:           105 bpm. 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 CHF, Abnormal ECG, Pulmonary HTN,                 Arrythmias:Atrial Fibrillation, Signs/Symptoms:Dyspnea and Chest                  Pain; Risk Factors:Diabetes.  Sonographer:    Roseanna Rainbow RDCS Referring Phys: Shaune Pascal Prabhnoor Ellenberger  Sonographer Comments: Technically difficult study due to poor echo windows and patient is obese. Image acquisition challenging due to patient body habitus. IMPRESSIONS  1. Left ventricular ejection fraction, by estimation, is 55%. The left ventricle has no regional wall motion abnormalities. Left ventricular diastolic parameters are indeterminate.  2. D-shaped septum suggesting RV pressure/volume overload. Right ventricular systolic function is mildly reduced. The right ventricular size is moderately enlarged. Tricuspid regurgitation signal is inadequate for assessing PA pressure.  3. Left atrial size was moderately dilated.  4. Right atrial size was moderately dilated.  5. Negative bubble study, no evidence for PFO or ASD.  6. The patient was in atrial fibrillation.  7. Limited echo with no doppler. FINDINGS  Left Ventricle: Left ventricular ejection fraction, by estimation, is 55%. The left ventricle has no regional wall motion abnormalities. Left ventricular diastolic parameters are indeterminate. Right Ventricle: D-shaped septum suggesting RV pressure/volume overload. The right ventricular size is moderately enlarged. Right ventricular systolic function is mildly reduced. Tricuspid regurgitation signal is inadequate for assessing PA pressure. Left Atrium: Left atrial size was moderately dilated. Right Atrium: Right atrial size was moderately dilated. IAS/Shunts: Negative bubble study, no evidence for PFO or ASD. Agitated saline contrast was given intravenously to evaluate for intracardiac shunting. Additional Comments: A device lead is visualized in the right ventricle. [employer] Electronically signed by Franki Monte Signature Date/Time: 08/13/2022/1:44:33 PM    Final      Medications:     Scheduled Medications:  [MAR Hold] azithromycin  250 mg Oral Daily   [MAR Hold] diclofenac Sodium  2 g Topical  QID   [MAR Hold] gabapentin  200 mg Oral TID   [MAR Hold] insulin aspart  0-9 Units Subcutaneous TID WC   [MAR Hold] metoprolol tartrate  25 mg Oral BID   [MAR Hold] pantoprazole  40 mg Oral BID   [MAR Hold] polyethylene glycol  17  g Oral BID   [MAR Hold] senna-docusate  1 tablet Oral BID   [MAR Hold] spironolactone  25 mg Oral Daily    Infusions:  sodium chloride 10 mL/hr at 08/14/22 0503   heparin 1,400 Units/hr (08/12/22 1447)    PRN Medications: [MAR Hold] acetaminophen, [MAR Hold] ipratropium-albuterol, [MAR Hold] lip balm, [MAR Hold] phenol, [MAR Hold] sodium chloride    Patient Profile   Ms. Joens is a 76 y.o AAF with PMH of HTN, DM, Afib on warfarin, syncope and pauses s/p MDT PPM, HLD, HFpEF, and PE 17'. AHF team asked to see for New pulmonary HTN.     Assessment/Plan   1. Pulmonary Hypertension - ECHO on admission revealed moderate to severe pulm HTN, RV mildly enlarged, PASP est 68.6, severe TR - RHC demonstrated mild-mod PAH w/ nl CO and left sided filling pressures and evidence of probable small intracardiac shunt. Bubble study negative - TEE today - Suspect primarily WHO Group 3 PAH. Will need sleep study, weight loss, etc. Not candidate for selective pulmonary artery vasodilators.   Acute on Chronic diastolic heart failure - ECHO 9/28: EF 55-60%. RV mildly enlarged, LA/RA mod dilated, severely elevated PASP est 68.6, no MR, severe TR - Presented NYHA IV on admission - admission CXR showed: New infiltrates in the right lower lung favored to represent edema, Cardiomegaly and pulmonary vascular congestion, now improved - CT chest (-) PE, Morphologic changes in keeping with PAH and at least some degree of right heart failure - RHC demonstrated mild-mod PAH w/ nl CO and left sided filling pressures - volume status much improved - back on po diuretics. Would switch home furosemide to torsemide 20 daily.  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 ABG without hypercapnia so doesn't qualify for home Bipap without sleep study. Would consider overnight oximetry to assess need for nocturnal O2.    HTN - stable, continue current regimen   A fib, chronic - Rate controlled  - On Warfarin at home - On heparin gtt 2/2 not being able to keep medications down. Would start warfarin as tolerated per primary team. (Can she take DOAC?) - Continue metoprolol 26m BID - needs OP sleep study - [facility]  AHF team will sign off pending results of TEE today   Length of Stay: [place] MD  08/14/2022, 7:47 AM  Advanced Heart Failure Team Pager [phone](M-F; 7a - 5p)  Please contact [facility] for night-coverage after hours (5p -7a ) and weekends on [email or web address]

## 2022-08-14 NOTE — Progress Notes (Signed)
Daily Progress Note Intern Pager: [phone]  Patient name: Candice Mclean Medical record number: [id number] Date of birth: 07/18/1946 Age: 76 y.o. Gender: female  Primary Care Provider: Janene Madeira, Vernice Jefferson, MD Consultants: Cardiology, heart failure, CCM, PT/OT Code Status: Full  Pt Overview and Major Events to Date:  9/28: Admitted to [facility] 9/30: Rapid response hypoxia, transition to BiPAP, chest x-ray bilateral opacities, ABG 7.44 pH 10/2: Patient became febrile, repeat urine and blood cultures, which ceftriaxone to cefepime 10/3: Heart cath showed mild to moderate PAH WHO class III, chest x-ray showed improvement of opacities in left lower lung field 10/5: Antibiotic course is completed, TEE was completed  Assessment and Plan: Candice Mclean is a 76 year old female who presents with shortness of breath.  She continues to improve.  Shortness of breath differential includes viral versus bacterial pneumonia (treated), pulmonary hypertension WHO class III, and CHF.  Suspect OSA, to be followed up outpatient.  Pertinent PMH/PSH includes first-degree AVB, thyroid nodule, asthma, renal artery embolism, syncope, PAF, SSS status post Medtronic dual-chamber pacemaker.  * Acute respiratory failure with hypoxia (HCC) Patient on 5 L nasal cannula, continue to wean as tolerated.  Antibiotic therapy completed today.  Anticipate she will likely go home on oxygen.  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  TEE showed LVEF of 60-65%, mildly dilated right ventricle.  Severely dilated right atrium, severe tricuspid regurgitation, and atrial septal aneurysm with PFO.  Follow-up  outpatient per cards. -Follow-up with outpatient for OSA -CPAP/BiPAP at discharge -Appreciate continued cardiology recs   A-fib [facility]) Consider switching heparin to warfarin after TEE. -Consider switching to warfarin after TEE -Home metoprolol 64m BID   CHF (congestive heart failure) ([place] Net down 6.5 L.  Potassium 3.8 today. -Continue spironolactone 25 mg daily - P.o. torsemide today   Extremity pain - 200 mg gabapentin 3 times daily  Diabetes (HCC) Blood glucose levels remain within goal. -sSSI -CBG q6  Abdominal pain Patient is not ambulating, and still has poor oral intake. -Miralax  -Senna  Vomiting-resolved as of 08/13/2022     FEN/GI: Regular PPx: Heparin Dispo:SNF pending clinical improvement .   Subjective:  No acute concerns, reports improved breathing.  We discussed that she will go to SNF, she is okay with this plan as long as it is less than 30 days. Objective: Temp:  [96.8 F (36 C)-98.9 F (37.2 C)] 96.8 F (36 C) (10/05 0824) Pulse Rate:  [82-109] 87 (10/05 0844) Resp:  [15-26] 18 (10/05 0844) BP: (103-140)/(66-91) 134/86 (10/05 0844) SpO2:  [93 %-98 %] 95 % (10/05 0844) Physical Exam: General: Not in acute distress, pleasant Cardio: Regular rate and rhythm Respiratory: CTAB, normal work of breathing on 5 L nasal cannula Extremities: Warm, dry, no edema Neuro: Alert oriented x3  Laboratory: Most recent CBC Lab Results  Component Value Date   WBC 9.0 08/14/2022   HGB 10.9 (L) 08/14/2022   HCT 31.7 (L) 08/14/2022   MCV 80.5 08/14/2022   PLT 325 08/14/2022   Most recent BMP    Latest Ref Rng & Units 08/14/2022    4:02 AM  BMP  Glucose 70 - 99 mg/dL 152   BUN 8 - 23 mg/dL 29  Creatinine 0.44 - 1.00 mg/dL 1.13   Sodium 135 - 145 mmol/L 130   Potassium 3.5 - 5.1 mmol/L 3.8   Chloride 98 - 111 mmol/L 93   CO2 22 - 32 mmol/L 26   Calcium 8.9 - 10.3 mg/dL 9.2       Imaging/Diagnostic Tests: Echo with bubble  study 10/4 Negative bubble study, no evidence for PFO or ASD.   [name], DO 08/14/2022, 9:50 AM  PGY-1, [place] Intern pager: [phone], text pages welcome Secure chat group [place]

## 2022-08-14 NOTE — TOC CM/SW Note (Signed)
HF TOC CM contacted RT and plan is to complete PFT today. [place] to make aware for BIPAP for home. [place], Heart Failure TOC CM [phone]

## 2022-08-15 ENCOUNTER — Telehealth (HOSPITAL_COMMUNITY): Payer: Self-pay

## 2022-08-15 ENCOUNTER — Other Ambulatory Visit (HOSPITAL_COMMUNITY): Payer: Self-pay

## 2022-08-15 DIAGNOSIS — I5031 Acute diastolic (congestive) heart failure: Secondary | ICD-10-CM | POA: Diagnosis not present

## 2022-08-15 DIAGNOSIS — J9601 Acute respiratory failure with hypoxia: Secondary | ICD-10-CM | POA: Diagnosis not present

## 2022-08-15 DIAGNOSIS — J189 Pneumonia, unspecified organism: Secondary | ICD-10-CM | POA: Diagnosis not present

## 2022-08-15 DIAGNOSIS — I272 Pulmonary hypertension, unspecified: Secondary | ICD-10-CM | POA: Diagnosis not present

## 2022-08-15 LAB — BASIC METABOLIC PANEL
Anion gap: 10 (ref 5–15)
BUN: 29 mg/dL — ABNORMAL HIGH (ref 8–23)
CO2: 25 mmol/L (ref 22–32)
Calcium: 9.5 mg/dL (ref 8.9–10.3)
Chloride: 97 mmol/L — ABNORMAL LOW (ref 98–111)
Creatinine, Ser: 1.09 mg/dL — ABNORMAL HIGH (ref 0.44–1.00)
GFR, Estimated: 54 mL/min — ABNORMAL LOW (ref >60)
Glucose, Bld: 148 mg/dL — ABNORMAL HIGH (ref 70–99)
Potassium: 4.5 mmol/L (ref 3.5–5.1)
Sodium: 132 mmol/L — ABNORMAL LOW (ref 135–145)

## 2022-08-15 LAB — PROTIME-INR
INR: 1.6 — ABNORMAL HIGH (ref 0.8–1.2)
Prothrombin Time: 18.4 seconds — ABNORMAL HIGH (ref 11.4–15.2)

## 2022-08-15 LAB — HEPARIN LEVEL (UNFRACTIONATED): Heparin Unfractionated: 0.49 IU/mL (ref 0.30–0.70)

## 2022-08-15 LAB — CBC
HCT: 35.3 % — ABNORMAL LOW (ref 36.0–46.0)
Hemoglobin: 11.8 g/dL — ABNORMAL LOW (ref 12.0–15.0)
MCH: 27.2 pg (ref 26.0–34.0)
MCHC: 33.4 g/dL (ref 30.0–36.0)
MCV: 81.3 fL (ref 80.0–100.0)
Platelets: 279 10*3/uL (ref 150–400)
RBC: 4.34 MIL/uL (ref 3.87–5.11)
RDW: 18.8 % — ABNORMAL HIGH (ref 11.5–15.5)
WBC: 8.9 10*3/uL (ref 4.0–10.5)
nRBC: 0 % (ref 0.0–0.2)

## 2022-08-15 LAB — GLUCOSE, CAPILLARY
Glucose-Capillary: 207 mg/dL — ABNORMAL HIGH (ref 70–99)
Glucose-Capillary: 145 mg/dL — ABNORMAL HIGH (ref 70–99)
Glucose-Capillary: 284 mg/dL — ABNORMAL HIGH (ref 70–99)

## 2022-08-15 LAB — MAGNESIUM: Magnesium: 2 mg/dL (ref 1.7–2.4)

## 2022-08-15 MED ORDER — WARFARIN SODIUM 5 MG PO TABS
7.5000 mg | ORAL_TABLET | Freq: Once | ORAL | Status: AC
  Administered 2022-08-15: 7.5 mg via ORAL
  Filled 2022-08-15: qty 1

## 2022-08-15 MED ORDER — WARFARIN - PHARMACIST DOSING INPATIENT: Freq: Every day | Status: DC

## 2022-08-15 NOTE — Telephone Encounter (Signed)
Pharmacy Patient Advocate Encounter  Insurance verification completed.    The patient is insured through [place] Part D   The patient is currently admitted and ran test claims for the following: Eliquis 83m and Xarelto 281m  Copays and coinsurance results were relayed to Inpatient clinical team.

## 2022-08-15 NOTE — Progress Notes (Signed)
[place] for heparin>>warfarin bridge Indication: atrial fibrillation  Allergies  Allergen Reactions   Versed [Midazolam] Hives   Tylenol With Codeine #3 [Acetaminophen-Codeine] Palpitations    Patient Measurements: Height: _0  (157.5 cm) Weight: 116.1 kg (255 lb 15.3 oz) IBW/kg (Calculated) : 50.1 Heparin Dosing Weight: 78.7kg   Vital Signs: Temp: 98 F (36.7 C) (10/06 0854) Temp Source: Oral (10/06 0854) BP: 124/52 (10/06 0854) Pulse Rate: 92 (10/06 0854)  Labs: Recent Labs    08/13/22 0501 08/14/22 0402 08/15/22 0417  HGB 11.2* 10.9* 11.8*  HCT 32.6* 31.7* 35.3*  PLT 298 325 279  LABPROT 18.2* 19.2* 18.4*  INR 1.5* 1.6* 1.6*  HEPARINUNFRC 0.38 0.50 0.49  CREATININE 1.14* 1.13* 1.09*     Estimated Creatinine Clearance: 55.5 mL/min (A) (by C-G formula based on SCr of 1.09 mg/dL (H)).   Medical History: Past Medical History:  Diagnosis Date   AF (atrial fibrillation) (HCC)    CHF (congestive heart failure) (HCC)    Coronary artery disease    Diabetes mellitus without complication ([place])    Hypertension    Medtronic pacemaker 2015    Medications:  Scheduled:   diclofenac Sodium  2 g Topical QID   gabapentin  200 mg Oral TID   insulin aspart  0-9 Units Subcutaneous TID WC   metoprolol tartrate  25 mg Oral BID   pantoprazole  40 mg Oral BID   polyethylene glycol  17 g Oral BID   senna-docusate  1 tablet Oral BID   spironolactone  25 mg Oral Daily   torsemide  20 mg Oral Daily   Infusions:   heparin 1,400 Units/hr (08/14/22 1506)    Candice Mclean: 76 YO female presenting 9/28 with sudden onset dysphagia, associated vomiting, and subsequently developed SOB. Patient has a history of afib with PTA warfarin regimen of 7.5 mg on Sun/Mon/Thurs and 5 mg on all other days--no doses since admission. Patient's INR was 2.5 on admission and heparin was started 10/1 after INR < 2. Patient is now transitioning back to warfarin  in anticipation of discharge. Discussed possibility of DOAC over warfarin and patient was not interested given the high copay.   Heparin level today remains therapeutic at 0.49, on 1400 units/hr. Hgb 11.8, plt 279--stable. Patient's INR is subtherapeutic at 1.6 today. No line issues or signs/symptoms of bleeding reported. Will give first dose of warfarin tonight and bridge with heparin at same rate.   Discussed with primary team and will stop bridging once the patient's INR is therapeutic or when she discharges (whichever comes first) given her indication of afib.   Goal of Therapy:  Heparin level 0.3-0.7 units/ml INR goal 2-3  Monitor platelets by anticoagulation protocol: Yes   Plan:  Continue heparin 1400 units/hr Give warfarin 7.28m tonight  Monitor heparin level, INR, CBC, and s/sx of bleeding daily    SBilley Gosling PharmD PGY1 Pharmacy Resident 10/6/20239:00 AM

## 2022-08-15 NOTE — Progress Notes (Addendum)
RT called to the patient's room per son's request. The patient's son had a lot of questions about BIPAP / CPAP for home use. His main concern was "is this a recommendation or necessity". I advised him to refer to the patient's physician in regards to his question. This RT called pulmonary but they have signed off. Dr. Andria Frames was messaged via secure chat and made aware that pulmonary has signed off & that the patient's son still has questions. Dr. Nancy Fetter came to the bedside before this RT's arrival and spoke with the son about the BIPAP.

## 2022-08-15 NOTE — Progress Notes (Signed)
I spoke with pulmonary/critical care regarding this patient and asked them to weigh in on the necessity of the BiPAP/NIV for this patient when she has not had a formal sleep study yet and to address son's questions about necessity of BiPAP.  They will try to have someone round on the patient over the weekend.  Greatly appreciate their assistance.

## 2022-08-15 NOTE — TOC Benefit Eligibility Note (Signed)
Patient Teacher, English as a foreign language completed.    The current 30 day co-pay is, $147.48 for Xarelto 49m.   The current 30 day co-pay is, $152.36 for Eliquis 517m    The patient is insured through [employer] [place] Team Direct Number: [phone]: [phone]

## 2022-08-15 NOTE — Progress Notes (Signed)
Notified by RN that patient had some questions regarding discharge.  Went to bedside to speak with son.  His main question was about the necessity of  NIV, whether this was a "necessity" or a "recommendation".  He was concerned about cost as NIV will cost thousands of dollars and is questioning the necessity as she has not had a sleep study.  I explained her PAH and how this was likely group 3/pulmonary disease but I told him that I would speak with the team about how important the NIV is as I could not say with certainty just how necessary NIV is vs discharge on home oxygen and waiting to obtain outpatient sleep study first.  He also requested to speak with respiratory about this matter.

## 2022-08-15 NOTE — TOC Progression Note (Addendum)
Transition of Care [facility]) - Progression Note    Patient Details  Name: Candice Mclean MRN: [id number] Date of Birth: 05/25/1946  [facility] CM/SW [place], LCSW Phone Number: 08/15/2022, 11:12 AM  Clinical Narrative:    11am-CSW spoke with patient's daughter regarding discharge plan. She is in agreement with SNF placement in the [place] or [facility] area. CSW sent referrals for review to see who can accept patient on 5L of oxygen and Bipap. CSW sent message to [employer] to ask about the timing of Bipap delivery to see if it will come in time to send to SNF with patient or if SNF will have to order one for her to use while there.   12pm-Per [employer], CSW can see if SNF can order Bipap for patient. If not, [employer] can work on NIV and see if SNF will agree to use it in their facility.   CSW emailed SNF bed offers to daughter at [email or web address].  Expected Discharge Plan: Skilled Nursing Facility Barriers to Discharge: SNF Pending bed offer  Expected Discharge Plan and Services Expected Discharge Plan: [place] In-house Referral: Clinical Social Work Discharge Planning Services: CM Consult Post Acute Care Choice: [place] arrangements for the past 2 months: [place]                   DME Agency: [employer] Date DME Agency Contacted: 08/12/22 Time DME Agency Contacted: 1501 Representative spoke with at DME Agency: Thedore Mins             Social Determinants of Health ([place]) Interventions    Readmission Risk Interventions     No data to display

## 2022-08-15 NOTE — Progress Notes (Signed)
Occupational Therapy Treatment Patient Details Name: Candice Mclean MRN: [id number] DOB: 1946/06/23 Today's Date: 08/15/2022   History of present illness 76 y.o. female presenting with sudden onset dysphagia with associated vomiting and subsequently developed SOB. 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 10/5 s/p TEE: showed LVEF of 60-65%, mildly dilated right ventricle. Severely dilated right atrium, severe tricuspid regurgitation, and atrial septal aneurysm with PFO. Follow-up outpatient per cards." PMHx first degree AVB, thyroid nodule, asthma, renal artery embolism, syncope, pAF, and SSS s/p Medtronic dual chamber pacemaker.   OT comments  Plan of care reviewed and goals downgraded.  Patient is needing+2 for any mobility and use of STEDY to transition to the recliner.  Patient near total assist for hygiene and lower body ADL.  Deficits listed below, but SNF for prolonged rehab stay to maximize function for eventual return home.  OT to follow in the acute setting.     Recommendations for follow up therapy are one component of a multi-disciplinary discharge planning process, led by the attending physician.  Recommendations may be updated based on patient status, additional functional criteria and insurance authorization.    Follow Up Recommendations  Skilled nursing-short term rehab (<3 hours/day)    Assistance Recommended at Discharge    Patient can return home with the following  A lot of help with bathing/dressing/bathroom;Two people to help with walking and/or transfers   Equipment Recommendations  Wheelchair (measurements OT);Wheelchair cushion (measurements OT)    Recommendations for Other Services      Precautions / Restrictions Restrictions Weight Bearing Restrictions: No       Mobility Bed Mobility   Bed Mobility: Sidelying to Sit     Supine to sit: Max assist, +2 for physical assistance       Patient Response:  Cooperative  Transfers Overall transfer level: Needs assistance Equipment used: Ambulation equipment used Transfers: Sit to/from Stand Sit to Stand: Mod assist, +2 physical assistance                 Balance Overall balance assessment: Needs assistance Sitting-balance support: Feet supported Sitting balance-Leahy Scale: Fair     Standing balance support: Reliant on assistive device for balance Standing balance-Leahy Scale: Poor                             ADL either performed or assessed with clinical judgement   ADL       Grooming: Wash/dry hands;Wash/dry face;Set up;Bed level   Upper Body Bathing: Moderate assistance;Bed level   Lower Body Bathing: Total assistance;Bed level   Upper Body Dressing : Sitting;Maximal assistance   Lower Body Dressing: Maximal assistance;Bed level   Toilet Transfer: Air cabin crew Details (indicate cue type and reason): STEDY Toileting- Clothing Manipulation and Hygiene: Total assistance              Extremity/Trunk Assessment Upper Extremity Assessment Upper Extremity Assessment: Generalized weakness   Lower Extremity Assessment Lower Extremity Assessment: Defer to PT evaluation   Cervical / Trunk Assessment Cervical / Trunk Assessment: Other exceptions Cervical / Trunk Exceptions: body habitus    Vision Patient Visual Report: No change from baseline     Perception Perception Perception: Within Functional Limits   Praxis Praxis Praxis: Intact    Cognition Arousal/Alertness: Awake/alert Behavior During Therapy: Flat affect Overall Cognitive Status: Within Functional Limits for tasks assessed  Exercises      Shoulder Instructions       General Comments  VSS on O2    Pertinent Vitals/ Pain       Pain Assessment Faces Pain Scale: Hurts even more Pain Location: Knees Pain Descriptors / Indicators: Guarding, Grimacing,  Discomfort, Sore Pain Intervention(s): Monitored during session                                                          Frequency  Min 2X/week        Progress Toward Goals  OT Goals(current goals can now be found in the care plan section)  Progress towards OT goals: Goals drowngraded-see care plan  Acute Rehab OT Goals OT Goal Formulation: With patient Time For Goal Achievement: 08/29/22 Potential to Achieve Goals: Fair ADL Goals Pt Will Perform Upper Body Bathing: sitting;with min assist Pt Will Perform Upper Body Dressing: with min assist;sitting Pt Will Transfer to Toilet: with mod assist;stand pivot transfer;bedside commode  Plan Discharge plan remains appropriate    Co-evaluation    PT/OT/SLP Co-Evaluation/Treatment: Yes Reason for Co-Treatment: Complexity of the patient's impairments (multi-system involvement);To address functional/ADL transfers;For patient/therapist safety          AM-PAC OT "6 Clicks" Daily Activity     Outcome Measure   Help from another person eating meals?: None Help from another person taking care of personal grooming?: None Help from another person toileting, which includes using toliet, bedpan, or urinal?: A Lot Help from another person bathing (including washing, rinsing, drying)?: A Lot Help from another person to put on and taking off regular upper body clothing?: A Lot Help from another person to put on and taking off regular lower body clothing?: Total 6 Click Score: 15    End of Session Equipment Utilized During Treatment: Oxygen;Other (comment) (STEDY)  OT Visit Diagnosis: Unsteadiness on feet (R26.81);Muscle weakness (generalized) (M62.81)   Activity Tolerance Patient limited by fatigue   Patient Left in chair;with call bell/phone within reach;with chair alarm set;with family/visitor present   Nurse Communication Mobility status        Time: 3654-2715 OT Time Calculation (min): 34  min  Charges: OT General Charges $OT Visit: 1 Visit OT Treatments $Therapeutic Activity: 8-22 mins  08/15/2022  RP, OTR/L  Acute Rehabilitation Services  Office:  [phone]   [name] 08/15/2022, 11:55 AM

## 2022-08-15 NOTE — Progress Notes (Signed)
Physical Therapy Treatment Patient Details Name: Candice Mclean MRN: [id number] DOB: 14-Oct-1946 Today's Date: 08/15/2022   History of Present Illness 76 y.o. female presenting with sudden onset dysphagia with associated vomiting and subsequently developed SOB. 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 10/5 s/p TEE: showed LVEF of 60-65%, mildly dilated right ventricle. Severely dilated right atrium, severe tricuspid regurgitation, and atrial septal aneurysm with PFO. Follow-up outpatient per cards." PMHx first degree AVB, thyroid nodule, asthma, renal artery embolism, syncope, pAF, and SSS s/p Medtronic dual chamber pacemaker.    PT Comments    Pt received in supine, agreeable to therapy session with encouragement, with fair participation and tolerance for bed mobility and transfer training. Pt needing active assist for supine LE exercises due to c/o fatigue and BLE "heaviness" and able to perform limited seated LE exercises and transfers x3 with up to +2 modA for sit<>stand and up to +2 maxA for bed mobility. Pt quick to fatigue this date, but agreeable to sit up in chair at least one hour. Pt bed controls malfunctioning, RN notified. Pt continues to benefit from PT services to progress toward functional mobility goals.    Recommendations for follow up therapy are one component of a multi-disciplinary discharge planning process, led by the attending physician.  Recommendations may be updated based on patient status, additional functional criteria and insurance authorization.  Follow Up Recommendations  Skilled nursing-short term rehab (<3 hours/day) Can patient physically be transported by private vehicle: No   Assistance Recommended at Discharge Frequent or constant Supervision/Assistance  Patient can return home with the following Assistance with cooking/housework;Direct supervision/assist for medications management;Direct supervision/assist for financial  management;Assist for transportation;Help with stairs or ramp for entrance;Two people to help with walking and/or transfers;A lot of help with bathing/dressing/bathroom   Equipment Recommendations  None recommended by PT (TBD)    Recommendations for Other Services       Precautions / Restrictions Precautions Precautions: Fall Precaution Comments: s/p RHC with RUE precs; tch O2 sats and HR; Droplet precs Restrictions Weight Bearing Restrictions: No     Mobility  Bed Mobility Overal bed mobility: Needs Assistance Bed Mobility: Sidelying to Sit, Rolling Rolling: Max assist, +2 for safety/equipment Sidelying to sit: Max assist, +2 for physical assistance       General bed mobility comments: assist for trunk and LE management, poor initiation    Transfers Overall transfer level: Needs assistance Equipment used: Ambulation equipment used Transfers: Sit to/from Stand Sit to Stand: Mod assist, +2 physical assistance           General transfer comment: mod +2 for power up, rise, hip extension to upright. Standing with +1 modA from elevated Stedy surface. Minimal weight shifting x1-2 reps each LE in stance prior to fatigue. Poor eccentric control to sit to lower surface    Ambulation/Gait               General Gait Details: pt unable to march in stance or fully lift feet off floor    Balance Overall balance assessment: Needs assistance Sitting-balance support: Feet supported, Single extremity supported Sitting balance-Leahy Scale:  (Poor progressing to Fair) Sitting balance - Comments: heavy R lateral leaning initially, intermittently requiring PTA assist to correct, to Fair with LUE support   Standing balance support: Reliant on assistive device for balance Standing balance-Leahy Scale: Poor Standing balance comment: Limited tolerance; modA +1  Cognition Arousal/Alertness: Awake/alert Behavior During Therapy: Flat  affect Overall Cognitive Status: Within Functional Limits for tasks assessed                       Memory: Decreased recall of precautions Following Commands: Follows one step commands with increased time     Problem Solving: Slow processing, Decreased initiation, Difficulty sequencing, Requires verbal cues General Comments: requires one-step cues and increased time        Exercises Other Exercises Other Exercises: supine BLE AAROM: ankle pumps (AROM), SAQ (AA), heel slides (AA), hip abduction (AA) x10 reps ea Other Exercises: seated BLE AROM: hip flexion, LAQ x 5 reps ea in chair Other Exercises: STS x 3 reps (1 from elevated surface)    General Comments General comments (skin integrity, edema, etc.): SpO2 87% desat on 4L, improved to 90% and greater on 6L with exertion      Pertinent Vitals/Pain Pain Assessment Pain Assessment: Faces Faces Pain Scale: Hurts even more Pain Location: Knees Pain Descriptors / Indicators: Guarding, Grimacing, Discomfort, Sore Pain Intervention(s): Monitored during session, Repositioned     PT Goals (current goals can now be found in the care plan section) Acute Rehab PT Goals Patient Stated Goal: to feel better PT Goal Formulation: With family Time For Goal Achievement: 08/22/22 Progress towards PT goals: Progressing toward goals    Frequency    Min 3X/week      PT Plan Current plan remains appropriate    Co-evaluation PT/OT/SLP Co-Evaluation/Treatment: Yes Reason for Co-Treatment: Complexity of the patient's impairments (multi-system involvement);To address functional/ADL transfers PT goals addressed during session: Mobility/safety with mobility;Balance;Proper use of DME;Strengthening/ROM        AM-PAC PT "6 Clicks" Mobility   Outcome Measure  Help needed turning from your back to your side while in a flat bed without using bedrails?: A Lot Help needed moving from lying on your back to sitting on the side of a flat  bed without using bedrails?: Total Help needed moving to and from a bed to a chair (including a wheelchair)?: Total Help needed standing up from a chair using your arms (e.g., wheelchair or bedside chair)?: A Lot Help needed to walk in hospital room?: Total Help needed climbing 3-5 steps with a railing? : Total 6 Click Score: 8    End of Session Equipment Utilized During Treatment: Gait belt;Oxygen Activity Tolerance: Patient tolerated treatment well;Patient limited by fatigue Patient left: in chair;with call bell/phone within reach;with chair alarm set;Other (comment);with family/visitor present (pt heels floated; son present) Nurse Communication: Mobility status;Other (comment);Need for lift equipment (use Stedy and +2; bed needs to be changed out, touch screen not working on bed/bed not charging properly?) PT Visit Diagnosis: Unsteadiness on feet (R26.81);Other abnormalities of gait and mobility (R26.89);Muscle weakness (generalized) (M62.81);Difficulty in walking, not elsewhere classified (R26.2)     Time: 2919-1660 PT Time Calculation (min) (ACUTE ONLY): 29 min  Charges:  $Therapeutic Exercise: 8-22 mins                     Menelik Mcfarren P., PTA Acute Rehabilitation Services Secure Chat Preferred 9a-5:30pm Office: [place] 08/15/2022, 1:55 PM

## 2022-08-15 NOTE — NC FL2 (Signed)
[place] LEVEL OF CARE SCREENING TOOL     IDENTIFICATION  Patient Name: Candice Mclean Birthdate: May 27, 1946 Sex: female Admission Date (Current Location): 08/06/2022  [facility] and [place] Number:  Herbalist and Address:  [facility] [place]. [facility], [place] [address] [place], [place] [address]      Provider Number: [id number]  Attending Physician Name and Address:  Zenia Resides, MD  Relative Name and Phone Number:       Current Level of Care: Hospital Recommended Level of Care: [place] Prior Approval Number:    Date Approved/Denied:   PASRR Number: [id number]  Discharge Plan: SNF    Current Diagnoses: Patient Active Problem List   Diagnosis Date Noted   Pneumonia of both lower lobes due to infectious organism    Pulmonary hypertension, unspecified ([place])    Extremity pain 08/10/2022   Abdominal pain 08/10/2022   Pulmonary hypertension, primary ([place]) 08/08/2022   SIRS (systemic inflammatory response syndrome) ([place]) 08/08/2022   Acute respiratory failure with hypoxia ([place]) 08/07/2022   Dysphagia    CHF (congestive heart failure) ([place])    Diabetes ([place]) 01/10/2017   Dyspnea 01/10/2017   Chest pain 01/10/2017   Costochondritis 01/10/2017   A-fib ([place]) 01/10/2017    Orientation RESPIRATION BLADDER Height & Weight     Self, Time, Situation, Place  O2 (5L Nasal cannula; Bipap) Incontinent, External catheter Weight: 255 lb 15.3 oz (116.1 kg) Height:  _0  (157.5 cm)  BEHAVIORAL SYMPTOMS/MOOD NEUROLOGICAL BOWEL NUTRITION STATUS      Continent Diet (See dc summary)  AMBULATORY STATUS COMMUNICATION OF NEEDS Skin   Extensive Assist Verbally Normal                       Personal Care Assistance Level of Assistance  Bathing, Feeding, Dressing Bathing Assistance: Maximum assistance Feeding assistance: Limited assistance Dressing Assistance: Limited assistance     Functional Limitations Info              SPECIAL CARE FACTORS FREQUENCY  PT (By licensed PT), OT (By licensed OT)     PT Frequency: 5x/week OT Frequency: 5x/week            Contractures Contractures Info: Not present    Additional Factors Info  Isolation Precautions Code Status Info: Full Allergies Info: Versed (Midazolam), Tylenol With Codeine #3 (Acetaminophen-codeine)     Isolation Precautions Info: Rhinovirus 08/08/22     Current Medications (08/15/2022):  This is the current hospital active medication list Current Facility-Administered Medications  Medication Dose Route Frequency Provider Last Rate Last Admin   acetaminophen (TYLENOL) tablet 650 mg  650 mg Oral Q6H PRN Ganta, Anupa, DO   650 mg at 08/15/22 0001   diclofenac Sodium (VOLTAREN) 1 % topical gel 2 g  2 g Topical QID Gerrit Heck, MD   2 g at 08/15/22 0920   gabapentin (NEURONTIN) capsule 200 mg  200 mg Oral TID Lenoria Chime, MD   200 mg at 08/15/22 0919   heparin ADULT infusion 100 units/mL (25000 units/282m)  1,400 Units/hr Intravenous Continuous BLaren Everts RPH 14 mL/hr at 08/14/22 1506 1,400 Units/hr at 08/14/22 1506   insulin aspart (novoLOG) injection 0-9 Units  0-9 Units Subcutaneous TID WC MErskine Emery MD   3 Units at 08/15/22 0920   ipratropium-albuterol (DUONEB) 0.5-2.5 (3) MG/3ML nebulizer solution 3 mL  3 mL Nebulization Q4H PRN PMick Sell PA-C  lip balm (CARMEX) ointment   Topical PRN Lenoria Chime, MD   75 Application at 34/19/62 2224   metoprolol tartrate (LOPRESSOR) tablet 25 mg  25 mg Oral BID Geralynn Rile, MD   25 mg at 08/15/22 0919   pantoprazole (PROTONIX) EC tablet 40 mg  40 mg Oral BID Alvira Philips,    40 mg at 08/15/22 0919   phenol (CHLORASEPTIC) mouth spray 1 spray  1 spray Mouth/Throat PRN Lenoria Chime, MD   1 spray at 08/08/22 2225   polyethylene glycol (MIRALAX / GLYCOLAX) packet 17 g  17 g Oral BID August Albino, MD   17 g at 08/15/22 0919   senna-docusate (Senokot-S)  tablet 1 tablet  1 tablet Oral BID August Albino, MD   1 tablet at 08/15/22 0919   sodium chloride (OCEAN) 0.65 % nasal spray 1 spray  1 spray Each Nare PRN Lenoria Chime, MD   1 spray at 08/08/22 2225   spironolactone (ALDACTONE) tablet 25 mg  25 mg Oral Daily August Albino, MD   25 mg at 08/15/22 0920   torsemide (DEMADEX) tablet 20 mg  20 mg Oral Daily Bensimhon, Shaune Pascal, MD   20 mg at 08/15/22 0920     Discharge Medications: Please see discharge summary for a list of discharge medications.  Relevant Imaging Results:  Relevant Lab Results:   Additional Information SSN: [id number].  Benard Halsted, LCSW

## 2022-08-15 NOTE — Progress Notes (Signed)
Daily Progress Note Intern Pager: [phone]  Patient name: Candice Mclean Medical record number: [id number] Date of birth: 08/19/1946 Age: 76 y.o. Gender: female  Primary Care Provider: Janene Madeira, Vernice Jefferson, MD Consultants: Heart failure (A/O), cardiology, CCM, PT OT Code Status: Full  Pt Overview and Major Events to Date:  9/28: Admitted to [facility] 9/30: Rapid response hypoxia, transition to BiPAP, chest x-ray bilateral opacities, ABG 7.44 pH 10/2: Patient became febrile, repeat urine and blood cultures, ceftriaxone cefepime 10/3: Heart cath showed mild to moderate PAH WHO class III, chest x-ray showed improvement of opacities in left lower lung field.  10/5: Antibiotic course was completed, TEE was completed 10/6: Patient deemed medically stable for SNF  Assessment and Plan: *Medically stable for discharge to SNF* Candice Mclean is a 76 year old female who presented with shortness of breath.  She continues to improve, and is hemodynamically stable.  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  Pneumonia was treated with full antibiotic course, and infiltrates improved on repeat chest x-ray.  No surgical intervention for CHF/PAH at this time.  Patient has BiPAP, and will follow-up outpatient for sleep study. Pertinent PMH/PSH includes first-degree AVB, thyroid nodule, asthma, renal artery embolism, syncope, PAF, SSS status post Medtronic dual-chamber pacemaker.   * Acute respiratory failure with hypoxia ([place]) Patient reports improvement of breathing. Patient on 5 L nasal cannula, continue to wean as tolerated.  Anticipate she will be discharged with oxygen. - BiPAP at night (patient tolerating well) - As needed DuoNebs every 4 for wheezing - Incentive spirometry, pulmonary hygiene - Monitor oxygen saturation, wean as tolerated  Pulmonary hypertension, primary ([place]) WHO class III.  Likely secondary to  OSA, Morbid Obesity Causing Restrictive Thoracic Disorder. No surgical intervention at this time per heart failure.  Primary team provided clear medical indication for BiPAP DME. -Follow-up with outpatient for OSA -BiPAP at discharge  A-fib ([place]) Irregular rate and rhythm on exam.  Patient was agreeable to [place], however due to donut hole she will be unable to afford this medication. We will restart warfarin. - Restart warfarin tonight, bridge with heparin -Home metoprolol 74m BID   CHF (congestive heart failure) (HCC) Net down 6.5 L.  Potassium and magnesium goal today. -Continue spironolactone 25 mg daily - P.o. torsemide daily   Extremity pain - 200 mg gabapentin 3 times daily  Diabetes (HCC) Blood glucose levels remain within goal. -sSSI -CBG q6  Abdominal pain Patient has had first bowel movement since admission today.  Continue bowel regimen -Miralax  -Senna  Vomiting-resolved as of 08/13/2022     FEN/GI: Regular PPx: Heparin Dispo:SNF  pending placement and insurance approval .  Subjective:  Patient reports that she is feeling better today, breathing is improved.  She has been able to get out of the bed to use restroom.  We discussed that we will restart warfarin, as DOAC was too expensive.  Objective: Temp:  [97.6 F (36.4 C)-98.8 F (37.1 C)] 98 F (36.7 C) (10/06 0854) Pulse Rate:  [76-103] 92 (10/06 0854) Resp:  [18-24] 20 (10/06 0854) BP: (106-148)/(52-88) 124/52 (10/06 0854) SpO2:  [94 %-96 %] 95 % (10/06 0854) Physical Exam: General: Not in acute distress, pleasant Cardiovascular: Irregular rate and rhythm Respiratory: CTAB, normal work of breathing on 5 L nasal cannula  Laboratory: Most recent CBC Lab Results  Component Value Date   WBC 8.9 08/15/2022   HGB 11.8 (L) 08/15/2022   HCT 35.3 (L)  08/15/2022   MCV 81.3 08/15/2022   PLT 279 08/15/2022   Most recent BMP    Latest Ref Rng & Units 08/15/2022    4:17 AM  BMP  Glucose 70 - 99  mg/dL 148   BUN 8 - 23 mg/dL 29   Creatinine 0.44 - 1.00 mg/dL 1.09   Sodium 135 - 145 mmol/L 132   Potassium 3.5 - 5.1 mmol/L 4.5   Chloride 98 - 111 mmol/L 97   CO2 22 - 32 mmol/L 25   Calcium 8.9 - 10.3 mg/dL 9.5     [name], DO 08/15/2022, 8:58 AM  PGY-1, [place] Intern pager: [phone], text pages welcome Secure chat group [place]

## 2022-08-15 NOTE — Anesthesia Postprocedure Evaluation (Signed)
Anesthesia Post Note  Patient: Candice Mclean  Procedure(s) Performed: TRANSESOPHAGEAL ECHOCARDIOGRAM (TEE)     Patient location during evaluation: Endoscopy Anesthesia Type: MAC Level of consciousness: awake and alert Pain management: pain level controlled Vital Signs Assessment: post-procedure vital signs reviewed and stable Respiratory status: spontaneous breathing, nonlabored ventilation, respiratory function stable and patient connected to nasal cannula oxygen Cardiovascular status: stable and blood pressure returned to baseline Postop Assessment: no apparent nausea or vomiting Anesthetic complications: no   No notable events documented.  Last Vitals:  Vitals:   08/15/22 0000 08/15/22 0106  BP: 115/79   Pulse: 99 (!) 103  Resp: 19 (!) 24  Temp:    SpO2: 95% 95%    Last Pain:  Vitals:   08/15/22 0046  TempSrc:   PainSc: [place]

## 2022-08-16 DIAGNOSIS — I5031 Acute diastolic (congestive) heart failure: Secondary | ICD-10-CM | POA: Diagnosis not present

## 2022-08-16 DIAGNOSIS — I482 Chronic atrial fibrillation, unspecified: Secondary | ICD-10-CM | POA: Diagnosis not present

## 2022-08-16 DIAGNOSIS — J189 Pneumonia, unspecified organism: Secondary | ICD-10-CM | POA: Diagnosis not present

## 2022-08-16 DIAGNOSIS — J9601 Acute respiratory failure with hypoxia: Secondary | ICD-10-CM | POA: Diagnosis not present

## 2022-08-16 LAB — CULTURE, BLOOD (ROUTINE X 2)
Culture: NO GROWTH
Culture: NO GROWTH
Special Requests: ADEQUATE

## 2022-08-16 LAB — CBC
HCT: 32.1 % — ABNORMAL LOW (ref 36.0–46.0)
Hemoglobin: 10.8 g/dL — ABNORMAL LOW (ref 12.0–15.0)
MCH: 27.2 pg (ref 26.0–34.0)
MCHC: 33.6 g/dL (ref 30.0–36.0)
MCV: 80.9 fL (ref 80.0–100.0)
Platelets: 338 10*3/uL (ref 150–400)
RBC: 3.97 MIL/uL (ref 3.87–5.11)
RDW: 18.7 % — ABNORMAL HIGH (ref 11.5–15.5)
WBC: 8.8 10*3/uL (ref 4.0–10.5)
nRBC: 0 % (ref 0.0–0.2)

## 2022-08-16 LAB — BLOOD GAS, ARTERIAL
Acid-Base Excess: 5.6 mmol/L — ABNORMAL HIGH (ref 0.0–2.0)
Bicarbonate: 29.8 mmol/L — ABNORMAL HIGH (ref 20.0–28.0)
Drawn by: 137461
O2 Saturation: 99.2 %
Patient temperature: 36.7
pCO2 arterial: 40 mmHg (ref 32–48)
pH, Arterial: 7.47 — ABNORMAL HIGH (ref 7.35–7.45)
pO2, Arterial: 95 mmHg (ref 83–108)

## 2022-08-16 LAB — PROTIME-INR
INR: 1.6 — ABNORMAL HIGH (ref 0.8–1.2)
Prothrombin Time: 18.9 seconds — ABNORMAL HIGH (ref 11.4–15.2)

## 2022-08-16 LAB — GLUCOSE, CAPILLARY
Glucose-Capillary: 206 mg/dL — ABNORMAL HIGH (ref 70–99)
Glucose-Capillary: 164 mg/dL — ABNORMAL HIGH (ref 70–99)
Glucose-Capillary: 206 mg/dL — ABNORMAL HIGH (ref 70–99)
Glucose-Capillary: 219 mg/dL — ABNORMAL HIGH (ref 70–99)

## 2022-08-16 LAB — HEPARIN LEVEL (UNFRACTIONATED): Heparin Unfractionated: 0.6 IU/mL (ref 0.30–0.70)

## 2022-08-16 MED ORDER — WARFARIN SODIUM 5 MG PO TABS
7.5000 mg | ORAL_TABLET | Freq: Once | ORAL | Status: AC
  Administered 2022-08-16: 7.5 mg via ORAL
  Filled 2022-08-16: qty 1

## 2022-08-16 NOTE — Progress Notes (Signed)
[place] for heparin>>warfarin bridge Indication: atrial fibrillation  Allergies  Allergen Reactions   Versed [Midazolam] Hives   Tylenol With Codeine #3 [Acetaminophen-Codeine] Palpitations    Patient Measurements: Height: _0  (157.5 cm) Weight: 116.1 kg (255 lb 15.3 oz) IBW/kg (Calculated) : 50.1kg Heparin Dosing Weight: 78.7 kg   Vital Signs: Temp: 98.2 F (36.8 C) (10/07 0345) Temp Source: Oral (10/07 0345) BP: 100/53 (10/07 0345) Pulse Rate: 79 (10/07 0430)  Labs: Recent Labs    08/14/22 0402 08/15/22 0417 08/16/22 0315  HGB 10.9* 11.8* 10.8*  HCT 31.7* 35.3* 32.1*  PLT 325 279 338  LABPROT 19.2* 18.4* 18.9*  INR 1.6* 1.6* 1.6*  HEPARINUNFRC 0.50 0.49 0.60  CREATININE 1.13* 1.09*  --     Estimated Creatinine Clearance: 55.5 mL/min (A) (by C-G formula based on SCr of 1.09 mg/dL (H)).   Medical History: Past Medical History:  Diagnosis Date   AF (atrial fibrillation) (HCC)    CHF (congestive heart failure) (HCC)    Coronary artery disease    Diabetes mellitus without complication (HCC)    Hypertension    Medtronic pacemaker 2015    Medications:  Scheduled:   diclofenac Sodium  2 g Topical QID   gabapentin  200 mg Oral TID   insulin aspart  0-9 Units Subcutaneous TID WC   metoprolol tartrate  25 mg Oral BID   pantoprazole  40 mg Oral BID   polyethylene glycol  17 g Oral BID   senna-docusate  1 tablet Oral BID   spironolactone  25 mg Oral Daily   torsemide  20 mg Oral Daily   Warfarin - Pharmacist Dosing Inpatient   Does not apply q1600   Infusions:   heparin 1,400 Units/hr (08/16/22 4707)    Assessment: 76 YO female presenting 9/28 with sudden onset dysphagia, associated vomiting, and subsequently developed SOB. Patient has a history of afib with PTA warfarin regimen of 7.5 mg on Sun/Mon/Thurs and 5 mg on all other days--no doses since admission. Patient's INR was 2.5 on admission and heparin was started  10/1 after INR < 2. Patient is now transitioning back to warfarin in anticipation of discharge. Discussed possibility of DOAC over warfarin and patient was not interested given the high copay.   Heparin level today remains therapeutic at 0.6, on 1400 units/hr. Hgb 10.8, plt 338--stable. Patient's INR is subtherapeutic at 1.6 today. No line issues or signs/symptoms of bleeding noted. Will give warfarin tonight and bridge with heparin at same rate.   Plan to stop bridging once the patient's INR is therapeutic or when she discharges (whichever comes first) given her indication of afib per primary team.   Goal of Therapy:  Heparin level 0.3-0.7 units/ml INR goal 2-3  Monitor platelets by anticoagulation protocol: Yes   Plan:  Continue heparin 1400 units/hr Give warfarin 7.73m tonight  Monitor heparin level, INR, CBC, and s/sx of bleeding daily   KJeneen Rinks Pharm.D PGY1 Pharmacy Resident 08/16/2022 7:14 AM

## 2022-08-16 NOTE — TOC Progression Note (Addendum)
Transition of Care [facility]) - Progression Note    Patient Details  Name: Katrice Goel MRN: [id number] Date of Birth: Oct 17, 1946  Transition of Care [facility] CM/SW Contact  Emeterio Reeve, [place] Phone Number: 08/16/2022, 3:29 PM  Clinical Narrative:     CSW spoke with pts daughter Lorenso Courier and son Alvester Chou on phone. Pt does not need insurance auth. They chose [facility] for their mother. CSW informed admissions coordinator.   Expected Discharge Plan: [place] Barriers to Discharge: SNF Pending bed offer  Expected Discharge Plan and Services Expected Discharge Plan: [place] In-house Referral: Clinical Social Work Discharge Planning Services: CM Consult Post Acute Care Choice: [place] arrangements for the past 2 months: [place]                   DME Agency: [employer] Date DME Agency Contacted: 08/12/22 Time DME Agency Contacted: 1501 Representative spoke with at DME Agency: [place] Determinants of Health ([place]) Interventions    Readmission Risk Interventions     No data to display         Emeterio Reeve, LCSW Clinical Social Worker

## 2022-08-16 NOTE — Progress Notes (Addendum)
NAME:  Alika Eppes, MRN:  [id number], DOB:  02-Jun-1946, LOS: 8 ADMISSION DATE:  08/06/2022, CONSULTATION DATE:  08/16/22 REFERRING MD:  Graciella Freer, CHIEF COMPLAINT:  hypoxia   History of Present Illness:  Pepper Kerrick is a 76 y.o. 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  She states that she had a mildly productive cough with some chills for several days prior and one sick contact.    In the ED, she was hypoxic to upper 80%'s requiring [place] oxygen, Covid negative,  CT chest with patchy infiltrates suggestive of pneumonia and febrile to 101F.  She was given initial Vanc and Cefepime with IVF and admitted to family medicine team.    Overnight, she had an episode of desaturation and required 15L HFNC, CTA chest negative for PE.  Cardiology was consulted when echo showed signs of severe PAH and tricuspid regurgitation and Lasix 85m bid initiation, consider RH cath. Troponins were trended and flat.   PCCM consulted in this setting   Pt reports a history of asthma, only uses albuterol for this and no recent severe exacerbations.  She has never been a smoker and does not use oxygen at home. Never been diagnosed with COPD or OSA  Pertinent  Medical History   has a past medical history of AF (atrial fibrillation) ([place], CHF (congestive heart failure) ([place], Coronary artery disease, Diabetes mellitus without complication ([place], Hypertension, and Medtronic pacemaker (2015).   Significant Hospital Events: Including procedures, antibiotic start and stop dates in addition to other pertinent events   9/28 admitted for pneumonia to FMTS 9/29 hypoxic event, PCCM consult, on 15L [place].  CTA negative for PE  Interim History / Subjective:  -4500 cc with 48 hours Tolerated BiPAP Weaning FiO2  Objective   Blood pressure 115/64, pulse 88, temperature  98.1 F (36.7 C), temperature source Oral, resp. rate 17, height _0  (1.575 m), weight 116.1 kg, SpO2 99 %.        Intake/Output Summary (Last 24 hours) at 08/16/2022 0940 Last data filed at 08/15/2022 2020 Gross per 24 hour  Intake --  Output 900 ml  Net -900 ml   Filed Weights   08/07/22 1337 08/09/22 0405 08/11/22 1233  Weight: 115.7 kg 116.1 kg 116.1 kg   PE General: Morbidly obese female laying in bed in no acute distress HEENT: MM pink/moist no JVD appreciated Neuro: No focal defects noted CV: Heart sounds are distant irregular PULM: Diminished breath sounds throughout all 4 L nasal cannula sats 94%  GI: soft, bsx4 active obese GU: Voids Extremities: warm/dry, 1+ edema  Skin: no rashes or lesions    Resolved Hospital Problem list     Assessment & Plan:   Acute Hypoxic Respiratory Failure  Viral pneumonia due to rhino virua ?Pneumonitis  ?Pulmonary Hypertension History of Asthma -Viral panel + for rhinovirus and CT chest with patchy infiltrates -Echo with preserved EF and severe tricuspid regurgitation and mild RV enlargement -Procal 0.12 > 0.10 -sputum culture for productive cough ordered 10/1>> Mono-nuclear with few gram + cocci in clusters and few gram negative rods.  P: -4.5 L for 48 hours which is contributing to improvement breathing mechanics She wore BiPAP for approximately 6 hours during the night but does not want to wear it at home She most likely needs noninvasive mechanical ventilatory support she can make arrangements  to [facility] or by her home physicians in [place] is 34% of predicted.  Appreciate cardiology input Increase activity Follow micro     Best Practice (right click and "Reselect all SmartList Selections" daily)   Per primary      [place] Acute Care Nurse Practitioner [place] Please consult [place] 08/16/2022, 9:40 AM

## 2022-08-16 NOTE — Progress Notes (Signed)
Daily Progress Note Intern Pager: [phone]  Patient name: Candice Mclean Medical record number: [id number] Date of birth: 1945/12/16 Age: 76 y.o. 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  10/5: Antibiotic course was completed, TEE was completed 10/6: Patient deemed medically stable for SNF  Assessment and Plan: Candice Mclean is a 76 year old female who presented with shortness of breath.  She continues to improve, and is hemodynamically stable.  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  Pneumonia was treated with full antibiotic course, and infiltrates improved on repeat chest x-ray.  No surgical intervention for CHF/PAH at this time.  Patient has BiPAP, and will follow-up outpatient for sleep study. Pertinent PMH/PSH includes first-degree AVB, thyroid nodule, asthma, renal artery embolism, syncope, PAF, SSS status post Medtronic dual-chamber pacemaker.   * Acute respiratory failure with hypoxia ([place]) Patient reports improvement of breathing. Patient on 4 L nasal cannula, continue to wean as tolerated.  Anticipate she will be discharged with oxygen. 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  No surgical intervention at this time per heart failure.  Primary team provided clear medical indication for BiPAP DME. PFTs with severe obstructive pattern. -Follow-up with outpatient for OSA -BiPAP at discharge -Pulmonary to reassess as family would like to discuss with them  A-fib ([place]) Irregular rate and rhythm on exam.  Patient was agreeable to [place], however due to donut hole she will be unable to afford this medication. We will restart warfarin. INR 1.6 10/7 - Warfarin bridged with heparin -Home metoprolol 32m BID   CHF (congestive heart failure) (HCC) Net down 6.5 L.  Potassium and magnesium goal today. Weight stable 116.1 kg -Continue spironolactone 25 mg daily - P.o. torsemide 20 mg daily   Extremity pain - 200 mg gabapentin 3 times daily  Diabetes (HCC) Blood glucose levels remain mostly within goal. One elevated reading to 284 -sSSI -CBG q6  Abdominal pain Had bowel movement in hospital.  Continue bowel regimen -Miralax 17 g BID -Senna 1 tablet BID  Vomiting-resolved as of 08/13/2022     FEN/GI: Regular PPx: Heparin bridge for warfarin Dispo: SNF pending bed placement and insurance approval  Subjective:  Patient says that she overall feels much better than previously and that her breathing has improved.  She has a little bit of leg pain however the gabapentin is helping with this well.  Objective: Temp:  [98 F (36.7 C)-98.6 F (37 C)] 98.2 F (36.8 C) (10/07 0345) Pulse Rate:  [79-102] 79 (10/07 0430) Resp:  [16-20] 20 (10/07 0430) BP: (100-124)/(53-74) 100/53 (10/07 0345) SpO2:  [95 %-99 %] 99 % (10/07 0430) Physical Exam: General: NAD, alert and responsive to all questions Cardiovascular: Regular rate, no murmurs rubs or gallops Respiratory: Clear to auscultation bilaterally, no wheezes rales or crackles, no increased work of  breathing on room air Abdomen: Soft, nontender to palpation, normal active bowel sounds Extremities: No pitting edema on  examination, no calf tenderness  Laboratory: Most recent CBC Lab Results  Component Value Date   WBC 8.8 08/16/2022   HGB 10.8 (L) 08/16/2022   HCT 32.1 (L) 08/16/2022   MCV 80.9 08/16/2022   PLT 338 08/16/2022   Most recent BMP    Latest Ref Rng & Units 08/15/2022    4:17 AM  BMP  Glucose 70 - 99 mg/dL 148   BUN 8 - 23 mg/dL 29   Creatinine 0.44 - 1.00 mg/dL 1.09   Sodium 135 - 145 mmol/L 132   Potassium 3.5 - 5.1 mmol/L 4.5   Chloride 98 - 111 mmol/L 97   CO2 22 - 32 mmol/L 25   Calcium 8.9 - 10.3 mg/dL 9.5    Gerrit Heck, MD 08/16/2022, 9:08 AM  PGY-2, [place] Intern pager: [phone], text pages welcome Secure chat group [place]

## 2022-08-17 ENCOUNTER — Encounter (HOSPITAL_COMMUNITY): Payer: Self-pay | Admitting: Internal Medicine

## 2022-08-17 DIAGNOSIS — J9601 Acute respiratory failure with hypoxia: Secondary | ICD-10-CM | POA: Diagnosis not present

## 2022-08-17 DIAGNOSIS — I5031 Acute diastolic (congestive) heart failure: Secondary | ICD-10-CM | POA: Diagnosis not present

## 2022-08-17 DIAGNOSIS — J189 Pneumonia, unspecified organism: Secondary | ICD-10-CM | POA: Diagnosis not present

## 2022-08-17 DIAGNOSIS — I482 Chronic atrial fibrillation, unspecified: Secondary | ICD-10-CM | POA: Diagnosis not present

## 2022-08-17 LAB — GLUCOSE, CAPILLARY
Glucose-Capillary: 179 mg/dL — ABNORMAL HIGH (ref 70–99)
Glucose-Capillary: 227 mg/dL — ABNORMAL HIGH (ref 70–99)
Glucose-Capillary: 178 mg/dL — ABNORMAL HIGH (ref 70–99)
Glucose-Capillary: 207 mg/dL — ABNORMAL HIGH (ref 70–99)

## 2022-08-17 LAB — BASIC METABOLIC PANEL
Anion gap: 14 (ref 5–15)
BUN: 35 mg/dL — ABNORMAL HIGH (ref 8–23)
CO2: 28 mmol/L (ref 22–32)
Calcium: 10 mg/dL (ref 8.9–10.3)
Chloride: 92 mmol/L — ABNORMAL LOW (ref 98–111)
Creatinine, Ser: 1.22 mg/dL — ABNORMAL HIGH (ref 0.44–1.00)
GFR, Estimated: 47 mL/min — ABNORMAL LOW (ref >60)
Glucose, Bld: 167 mg/dL — ABNORMAL HIGH (ref 70–99)
Potassium: 3.7 mmol/L (ref 3.5–5.1)
Sodium: 134 mmol/L — ABNORMAL LOW (ref 135–145)

## 2022-08-17 LAB — CBC
HCT: 33.2 % — ABNORMAL LOW (ref 36.0–46.0)
Hemoglobin: 10.9 g/dL — ABNORMAL LOW (ref 12.0–15.0)
MCH: 27 pg (ref 26.0–34.0)
MCHC: 32.8 g/dL (ref 30.0–36.0)
MCV: 82.2 fL (ref 80.0–100.0)
Platelets: 381 10*3/uL (ref 150–400)
RBC: 4.04 MIL/uL (ref 3.87–5.11)
RDW: 19.1 % — ABNORMAL HIGH (ref 11.5–15.5)
WBC: 8.5 10*3/uL (ref 4.0–10.5)
nRBC: 0 % (ref 0.0–0.2)

## 2022-08-17 LAB — PROTIME-INR
INR: 1.5 — ABNORMAL HIGH (ref 0.8–1.2)
Prothrombin Time: 18.2 seconds — ABNORMAL HIGH (ref 11.4–15.2)

## 2022-08-17 LAB — HEPARIN LEVEL (UNFRACTIONATED): Heparin Unfractionated: 0.53 IU/mL (ref 0.30–0.70)

## 2022-08-17 MED ORDER — WARFARIN SODIUM 5 MG PO TABS
7.5000 mg | ORAL_TABLET | Freq: Once | ORAL | Status: AC
  Administered 2022-08-17: 7.5 mg via ORAL
  Filled 2022-08-17: qty 1

## 2022-08-17 NOTE — Progress Notes (Signed)
BiPAP on S/B. Pt slept throughout night on [place].

## 2022-08-17 NOTE — Progress Notes (Signed)
[place] for heparin>>warfarin bridge Indication: atrial fibrillation  Allergies  Allergen Reactions   Versed [Midazolam] Hives   Tylenol With Codeine #3 [Acetaminophen-Codeine] Palpitations    Patient Measurements: Height: 5' 2" (157.5 cm) Weight: 116.1 kg (255 lb 15.3 oz) IBW/kg (Calculated) : 50.1kg Heparin Dosing Weight: 78.7 kg   Vital Signs: Temp: 98.2 F (36.8 C) (10/08 0422) Temp Source: Oral (10/08 0422) BP: 109/81 (10/08 0422) Pulse Rate: 69 (10/08 0422)  Labs: Recent Labs    08/15/22 0417 08/16/22 0315 08/17/22 0435  HGB 11.8* 10.8* 10.9*  HCT 35.3* 32.1* 33.2*  PLT 279 338 381  LABPROT 18.4* 18.9* 18.2*  INR 1.6* 1.6* 1.5*  HEPARINUNFRC 0.49 0.60 0.53  CREATININE 1.09*  --  1.22*    Estimated Creatinine Clearance: 49.6 mL/min (A) (by C-G formula based on SCr of 1.22 mg/dL (H)).   Medical History: Past Medical History:  Diagnosis Date   AF (atrial fibrillation) (HCC)    CHF (congestive heart failure) (HCC)    Coronary artery disease    Diabetes mellitus without complication ([place])    Hypertension    Medtronic pacemaker 2015    Medications:  Scheduled:   diclofenac Sodium  2 g Topical QID   gabapentin  200 mg Oral TID   insulin aspart  0-9 Units Subcutaneous TID WC   metoprolol tartrate  25 mg Oral BID   pantoprazole  40 mg Oral BID   polyethylene glycol  17 g Oral BID   senna-docusate  1 tablet Oral BID   spironolactone  25 mg Oral Daily   torsemide  20 mg Oral Daily   Warfarin - Pharmacist Dosing Inpatient   Does not apply q1600   Infusions:   heparin 1,400 Units/hr (08/17/22 0450)    Assessment: 76 YO female presenting 9/28 with sudden onset dysphagia, associated vomiting, and subsequently developed SOB. Patient has a history of afib with PTA warfarin regimen of 7.5 mg on Sun/Mon/Thurs and 5 mg on all other days--no doses since admission. Patient's INR was 2.5 on admission and heparin was started  10/1 after INR < 2. Patient is now transitioning back to warfarin in anticipation of discharge. Discussed possibility of DOAC over warfarin and patient was not interested given the high copay.   Heparin level today remains therapeutic at 0.53, on 1400 units/hr. Hgb 10.9, plt 381--stable. Patient's INR is subtherapeutic at 1.5 today after 2 doses of warfarin 7.5 mg. Expect INR to start increasing in the next few days. No line issues or signs/symptoms of bleeding noted. Will give warfarin tonight and bridge with heparin at same rate.   Plan to stop bridging once the patient's INR is therapeutic or when she discharges (whichever comes first) given her indication of afib per primary team.   Goal of Therapy:  Heparin level 0.3-0.7 units/ml INR goal 2-3  Monitor platelets by anticoagulation protocol: Yes   Plan:  Continue heparin 1400 units/hr Give warfarin 7.5 mg tonight  Monitor heparin level, INR, CBC, and s/sx of bleeding daily   Jeneen Rinks, Pharm.D PGY1 Pharmacy Resident 08/17/2022 7:50 AM

## 2022-08-17 NOTE — Progress Notes (Signed)
Daily Progress Note Intern Pager: [phone]  Patient name: Candice Mclean Medical record number: [id number] Date of birth: [birth year] Age: 76 y.o. Gender: female  Primary Care Provider: Janene Mclean, Candice Jefferson, MD Consultants: Heart failure, cardiology, pulmonology, PT/OT, respiratory therapy Code Status: Full  Pt Overview and Major Events to Date:  9/28: Admitted to FMTS 9/30: Rapid response hypoxia, transition to BiPAP, chest x-ray bilateral opacities, ABG 7.44 pH 10/2: Patient became febrile, repeat urine and blood cultures, cefepime 10/30: Heart cath showed mild-moderate PAH W HL group 3, chest x-ray showed improving opacities in left lower lung field. 10/5: Antibiotic course was completed, TEE was completed 10/6: Patient be medically stable for SNF 10/8: Patient was weaned to 1 L nasal cannula, no longer requiring BiPAP.  Assessment and Plan: Candice Mclean is a 76 year old female who presented with shortness of breath.  She continues to improve, is hemodynamically stable.  Shortness of breath differential includes pneumonia, PAH WHO group 3, CHF and OSA/obesity hypoventilation syndrome.  Pneumonia was treated with full antibiotic course, and infiltrates improved on repeat x-ray.  No surgical intervention for CHF/PAH at this time.  Patient was weaned from BiPAP, will follow-up patient for sleep study.   Pertinent past medical history includes first-degree AVB, thyroid nodule, asthma, renal or embolism, syncope, PAF, SSS status post Medtronic dual-chamber pacemaker.  * Acute respiratory failure with hypoxia (Candice Mclean) Patient reports improvement of breathing. Patient on 1 L nasal cannula.  Patient did not require BiPAP overnight to maintain oxygen saturation.  Pulmonology is recommending discontinuing BiPAP, and ambulatory pulse ox to assess for home oxygen need.  - Ambulatory pulse ox - Monitor oxygen saturation, wean as tolerated - As needed DuoNebs every 4 hours for  wheezing  Pulmonary hypertension, primary (Candice Mclean) 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  Recommended outpatient full PFT evaluation per pulmonology. -Follow-up with outpatient for OSA  A-fib (Candice Mclean) Irregular rate and rhythm on exam.  Patient was agreeable to [place], however due to donut hole she will be unable to afford this medication. We will restart warfarin. INR 1.6 10/7 - Warfarin bridged with heparin -Home metoprolol 24m BID   CHF (congestive heart failure) (Candice Mclean) Net down 6.5 L.  Potassium and magnesium goal today. Weight stable 116.1 kg -Continue spironolactone 25 mg daily - P.o. torsemide 20 mg daily   Extremity pain - 200 mg gabapentin 3 times daily  Diabetes (Candice Mclean) Blood glucose levels remain mostly within goal. One elevated reading to 284 -sSSI -CBG q6  Abdominal pain Had bowel movement in hospital.  Continue bowel regimen -Miralax 17 g BID -Senna 1 tablet BID  Vomiting-resolved as of 08/13/2022     FEN/GI: Regular PPx: Warfarin, heparin bridge Dispo:SNF  pending bed offer .   Subjective:  Patient reports improvement in breathing, and she feels much better.  She is able to ambulate from bed to bedside commode.  She is agreeable to SNF still.   Objective: Temp:  [97.9 F (36.6 C)-98.7 F (37.1 C)] 98.7 F (37.1 C) (10/08 0800) Pulse Rate:  [69-105] 92 (10/08 0800) Resp:  [18-20] 18 (10/08 0800) BP: (106-128)/(67-83) 124/82 (10/08 0800) SpO2:  [90 %-94 %] 94 % (10/08 0800) Physical Exam: General: Not in acute distress, pleasant Cardiovascular: Irregular rate and rhythm Respiratory: CTAB, normal work of breathing on 1 L nasal cannula Extremities: No edema, warm and dry  Laboratory: Most recent CBC Lab Results  Component Value Date   WBC 8.5 08/17/2022  HGB 10.9 (L) 08/17/2022   HCT 33.2 (L) 08/17/2022   MCV 82.2 08/17/2022   PLT 381 08/17/2022   Most recent BMP    Latest Ref Rng & Units  08/17/2022    4:35 AM  BMP  Glucose 70 - 99 mg/dL 167   BUN 8 - 23 mg/dL 35   Creatinine 0.44 - 1.00 mg/dL 1.22   Sodium 135 - 145 mmol/L 134   Potassium 3.5 - 5.1 mmol/L 3.7   Chloride 98 - 111 mmol/L 92   CO2 22 - 32 mmol/L 28   Calcium 8.9 - 10.3 mg/dL 10.0     [name], DO 08/17/2022, 10:29 AM  PGY-1, [place] Intern pager: [phone], text pages welcome Secure chat group [place]

## 2022-08-17 NOTE — Progress Notes (Signed)
BiPAP on S/B.

## 2022-08-17 NOTE — Progress Notes (Signed)
NAME:  Candice Mclean, MRN:  [id number], DOB:  1946/11/09, LOS: 9 ADMISSION DATE:  08/06/2022, CONSULTATION DATE:  08/17/22 REFERRING MD:  Graciella Freer, CHIEF COMPLAINT:  hypoxia   History of Present Illness:  Candice Mclean is a 76 y.o. 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  She states that she had a mildly productive cough with some chills for several days prior and one sick contact.    In the ED, she was hypoxic to upper 80%'s requiring [place] oxygen, Covid negative,  CT chest with patchy infiltrates suggestive of pneumonia and febrile to 101F.  She was given initial Vanc and Cefepime with IVF and admitted to family medicine team.    Overnight, she had an episode of desaturation and required 15L HFNC, CTA chest negative for PE.  Cardiology was consulted when echo showed signs of severe PAH and tricuspid regurgitation and Lasix 40m bid initiation, consider RH cath. Troponins were trended and flat.   PCCM consulted in this setting   Pt reports a history of asthma, only uses albuterol for this and no recent severe exacerbations.  She has never been a smoker and does not use oxygen at home. Never been diagnosed with COPD or OSA  Pertinent  Medical History   has a past medical history of AF (atrial fibrillation) ([place], CHF (congestive heart failure) ([place], Coronary artery disease, Diabetes mellitus without complication ([place], Hypertension, and Medtronic pacemaker (2015).   Significant Hospital Events: Including procedures, antibiotic start and stop dates in addition to other pertinent events   9/28 admitted for pneumonia to FMTS 9/29 hypoxic event, PCCM consult, on 15L [place].  CTA negative for PE 08/17/2022 did not require noninvasive during the night of 10 7  Interim History / Subjective:  Down to 1 L nasal cannula Did not wear  noninvasive during the night  Objective   Blood pressure 124/82, pulse 92, temperature 98.7 F (37.1 C), temperature source Oral, resp. rate 18, height _0  (1.575 m), weight 116.1 kg, SpO2 94 %.        Intake/Output Summary (Last 24 hours) at 08/17/2022 1012 Last data filed at 08/17/2022 08341Gross per 24 hour  Intake 240 ml  Output 2950 ml  Net -2710 ml   Filed Weights   08/07/22 1337 08/09/22 0405 08/11/22 1233  Weight: 115.7 kg 116.1 kg 116.1 kg   PE General: Obese female who appears more alert HEENT: MM pink/moist no JVD Neuro: Grossly intact without focal defect CV: Heart sounds are distant PULM: Diminished breath sounds in the bases  GI: soft, bsx4 active  GU: Voids Extremities: warm/dry, 1+ edema  Skin: no rashes or lesions     Resolved Hospital Problem list     Assessment & Plan:   Acute Hypoxic Respiratory Failure  Viral pneumonia due to rhino virua ?Pneumonitis  ?Pulmonary Hypertension History of Asthma Viral panel + for rhinovirus and CT chest with patchy infiltrates Echo with preserved EF and severe tricuspid regurgitation and mild RV enlargement -Procal 0.12 > 0.10 -sputum culture for productive cough ordered 10/1>> Mono-nuclear with few gram + cocci in clusters and few gram negative rods.  P: Tolerating being off BiPAP overnight without any ill effects and slept well with no desaturations Okay to be discharged home without noninvasive but she needs to follow-up with family physicians for a sleep study to rule  out OSA. Currently on 1 L nasal cannula which suggest walking her on room air to see if she desaturates she may or may not need oxygen at home.   Pulmonary critical care will sign off at this time  Appreciate cardiology input Increase activity Follow micro     Best Practice (right click and "Reselect all SmartList Selections" daily)   Per primary      [place] Acute Care Nurse Practitioner [place] Please consult Candice Mclean 08/17/2022, 10:12 AM

## 2022-08-18 LAB — CBC
HCT: 32.2 % — ABNORMAL LOW (ref 36.0–46.0)
Hemoglobin: 10.7 g/dL — ABNORMAL LOW (ref 12.0–15.0)
MCH: 27 pg (ref 26.0–34.0)
MCHC: 33.2 g/dL (ref 30.0–36.0)
MCV: 81.3 fL (ref 80.0–100.0)
Platelets: 357 10*3/uL (ref 150–400)
RBC: 3.96 MIL/uL (ref 3.87–5.11)
RDW: 19.4 % — ABNORMAL HIGH (ref 11.5–15.5)
WBC: 7.8 10*3/uL (ref 4.0–10.5)
nRBC: 0 % (ref 0.0–0.2)

## 2022-08-18 LAB — BASIC METABOLIC PANEL
Anion gap: 10 (ref 5–15)
BUN: 35 mg/dL — ABNORMAL HIGH (ref 8–23)
CO2: 27 mmol/L (ref 22–32)
Calcium: 9.4 mg/dL (ref 8.9–10.3)
Chloride: 95 mmol/L — ABNORMAL LOW (ref 98–111)
Creatinine, Ser: 1.04 mg/dL — ABNORMAL HIGH (ref 0.44–1.00)
GFR, Estimated: 57 mL/min — ABNORMAL LOW (ref >60)
Glucose, Bld: 235 mg/dL — ABNORMAL HIGH (ref 70–99)
Potassium: 3.7 mmol/L (ref 3.5–5.1)
Sodium: 132 mmol/L — ABNORMAL LOW (ref 135–145)

## 2022-08-18 LAB — GLUCOSE, CAPILLARY
Glucose-Capillary: 165 mg/dL — ABNORMAL HIGH (ref 70–99)
Glucose-Capillary: 212 mg/dL — ABNORMAL HIGH (ref 70–99)

## 2022-08-18 LAB — PROTIME-INR
INR: 1.5 — ABNORMAL HIGH (ref 0.8–1.2)
Prothrombin Time: 17.6 seconds — ABNORMAL HIGH (ref 11.4–15.2)

## 2022-08-18 LAB — HEPARIN LEVEL (UNFRACTIONATED): Heparin Unfractionated: 0.86 IU/mL — ABNORMAL HIGH (ref 0.30–0.70)

## 2022-08-18 MED ORDER — WARFARIN SODIUM 5 MG PO TABS: 5.0000 mg | ORAL_TABLET | ORAL | Status: AC

## 2022-08-18 MED ORDER — SPIRONOLACTONE 25 MG PO TABS: 25.0000 mg | ORAL_TABLET | Freq: Every day | ORAL | Status: AC

## 2022-08-18 MED ORDER — TORSEMIDE 20 MG PO TABS: 20.0000 mg | ORAL_TABLET | Freq: Every day | ORAL | Status: AC

## 2022-08-18 MED ORDER — DICLOFENAC SODIUM 1 % EX GEL: 2.0000 g | Freq: Four times a day (QID) | CUTANEOUS | Status: AC

## 2022-08-18 MED ORDER — WARFARIN SODIUM 5 MG PO TABS
5.0000 mg | ORAL_TABLET | Freq: Once | ORAL | Status: AC
  Administered 2022-08-18: 5 mg via ORAL
  Filled 2022-08-18: qty 1

## 2022-08-18 MED ORDER — GABAPENTIN 100 MG PO CAPS: 200.0000 mg | ORAL_CAPSULE | Freq: Three times a day (TID) | ORAL | Status: AC

## 2022-08-18 NOTE — Plan of Care (Signed)
  Problem: Education: Goal: Ability to describe self-care measures that may prevent or decrease complications (Diabetes Survival Skills Education) will improve Outcome: Adequate for Discharge Goal: Individualized Educational Video(s) Outcome: Adequate for Discharge   Problem: Coping: Goal: Ability to adjust to condition or change in health will improve Outcome: Adequate for Discharge   Problem: Fluid Volume: Goal: Ability to maintain a balanced intake and output will improve Outcome: Adequate for Discharge   Problem: Health Behavior/Discharge Planning: Goal: Ability to identify and utilize available resources and services will improve Outcome: Adequate for Discharge Goal: Ability to manage health-related needs will improve Outcome: Adequate for Discharge   Problem: Metabolic: Goal: Ability to maintain appropriate glucose levels will improve Outcome: Adequate for Discharge   Problem: Nutritional: Goal: Maintenance of adequate nutrition will improve Outcome: Adequate for Discharge Goal: Progress toward achieving an optimal weight will improve Outcome: Adequate for Discharge   Problem: Skin Integrity: Goal: Risk for impaired skin integrity will decrease Outcome: Adequate for Discharge   Problem: Tissue Perfusion: Goal: Adequacy of tissue perfusion will improve Outcome: Adequate for Discharge   Problem: Education: Goal: Knowledge of General Education information will improve Description: Including pain rating scale, medication(s)/side effects and non-pharmacologic comfort measures Outcome: Adequate for Discharge   Problem: Health Behavior/Discharge Planning: Goal: Ability to manage health-related needs will improve Outcome: Adequate for Discharge   Problem: Clinical Measurements: Goal: Ability to maintain clinical measurements within normal limits will improve Outcome: Adequate for Discharge Goal: Will remain free from infection Outcome: Adequate for Discharge Goal:  Diagnostic test results will improve Outcome: Adequate for Discharge Goal: Respiratory complications will improve Outcome: Adequate for Discharge Goal: Cardiovascular complication will be avoided Outcome: Adequate for Discharge   Problem: Activity: Goal: Risk for activity intolerance will decrease Outcome: Adequate for Discharge   Problem: Nutrition: Goal: Adequate nutrition will be maintained Outcome: Adequate for Discharge   Problem: Coping: Goal: Level of anxiety will decrease Outcome: Adequate for Discharge   Problem: Elimination: Goal: Will not experience complications related to bowel motility Outcome: Adequate for Discharge Goal: Will not experience complications related to urinary retention Outcome: Adequate for Discharge   Problem: Pain Managment: Goal: General experience of comfort will improve Outcome: Adequate for Discharge   Problem: Safety: Goal: Ability to remain free from injury will improve Outcome: Adequate for Discharge   Problem: Skin Integrity: Goal: Risk for impaired skin integrity will decrease Outcome: Adequate for Discharge   Problem: Education: Goal: Understanding of CV disease, CV risk reduction, and recovery process will improve Outcome: Adequate for Discharge Goal: Individualized Educational Video(s) Outcome: Adequate for Discharge   Problem: Activity: Goal: Ability to return to baseline activity level will improve Outcome: Adequate for Discharge   Problem: Cardiovascular: Goal: Ability to achieve and maintain adequate cardiovascular perfusion will improve Outcome: Adequate for Discharge Goal: Vascular access site(s) Level 0-1 will be maintained Outcome: Adequate for Discharge   Problem: Health Behavior/Discharge Planning: Goal: Ability to safely manage health-related needs after discharge will improve Outcome: Adequate for Discharge

## 2022-08-18 NOTE — TOC Transition Note (Signed)
Transition of Care [facility]) - CM/SW Discharge Note   Patient Details  Name: Candice Mclean MRN: [id number] Date of Birth: [birth year]  [facility] of [facility]) CM/SW Contact:  Benard Halsted, LCSW Phone Number: 08/18/2022, 1:20 PM   Clinical Narrative:    Patient will DC to: [facility] Anticipated DC date: 08/18/22 Family notified: Son, Alvester Chou Transport by: Corey Harold   Per MD patient ready for DC to [facility]. RN to call report prior to discharge [phone] room 130a). RN, patient, patient's family, and facility notified of DC. Discharge Summary and FL2 sent to facility. DC packet on chart. Ambulance transport requested for patient.   CSW will sign off for now as social work intervention is no longer needed. Please consult [place] again if new needs arise.     Final next level of care: Skilled Nursing Facility Barriers to Discharge: Barriers Resolved   Patient Goals and CMS Choice Patient states their goals for this hospitalization and ongoing recovery are:: wants to remain independent CMS Medicare.gov Compare Post Acute Care list provided to:: Patient Choice offered to / list presented to : Patient, Adult Children  Discharge Placement   Existing PASRR number confirmed : 08/18/22          Patient chooses bed at: [facility] Patient to be transferred to facility by: [facility] Name of family member notified: Son Patient and family notified of of transfer: 08/18/22  Discharge Plan and Services In-house Referral: Clinical Social Work Discharge Planning Services: [employer] Consult Post Acute Care Choice: [place]            DME Agency: [employer] Date DME Agency Contacted: 08/12/22 Time DME Agency Contacted: 1501 Representative spoke with at DME Agency: Thedore Mins            Social Determinants of Health ([place]) Interventions     Readmission Risk Interventions     No data to display

## 2022-08-18 NOTE — Discharge Summary (Addendum)
[facility] Discharge Summary  Patient name: Candice Mclean: [id number] Date of birth: 1946/02/10 Age: 76 y.o. Gender: female Date of Admission: 08/06/2022  Date of Discharge: 08/18/2022 Admitting Physician: Erskine Emery, MD  Primary Care Provider: Janene Madeira, Vernice Jefferson, MD Consultants: Heart failure, cardiology, CCM, TOC, respiratory therapy, PT/OT  Indication for Hospitalization: Acute respiratory failure with hypoxia  Brief Hospital Course:  Chiniqua Kilcrease is a 76 y.o.female with a history of A-fib, CHF, DM, HTN and who was admitted for sudden onset dysphagia with associated vomiting and subsequently developed SOB.Her hospital course is detailed below:   Acute respiratory failure with hypoxia: Patient required 3L [place] supplemental oxygen upon admission due to persistent desat and increased WOB. Patient met SIRS criteria for tachypnea and febrile episode. She was started on broad spectrum antibiotics. Patient continued to desat and have increased WOB and required 20L HFNC with transition to BiPaP nightly. CTA negative for PE. Showed potential LLL infiltrate and small airway disease. Pulmonology consulted and recommended antibiotic treatment, she was treated with cefepime and azithromycin. She completed her antibiotic course, and pulmonary infiltrates improved on x-ray.   Additionally cardiology was consulted and believed acute respiratory failure could also be secondary to severe worsening right-sided heart failure/pulmonary arterial hypertension. Cardiology did full work-up for PAH and heart failure which included heart cath, bubble study, TEE (see below).  Patient was weaned from their high flow nasal cannula to 5 L [place] during admission.  PT recommended SNF, as patient remains weak and has difficulty ambulating.  On 10/8 patient no longer required BiPap overnight to maintain O2 sat, pulmonology discontinued NIV.  Pulmonology  recommended outpatient sleep study.  At this time we believe her acute respiratory failure was multifactorial and included pneumonia, PAH, CHF and OSA/obesity hypoventilation syndrome.  Patient was stable and discharged to SNF. Stable and on room air at time of discharge.  CHF: Left ventricular ejection fraction 55 to 60%, severe tricuspid regurgitation on echo.  Cardiology was consulted, diuresed patient with Lasix 3 times daily, and started spironolactone per cardiology. 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  Heart cath was completed and no stents were placed during this admission.  She was switched to p.o. 31m torsemide daily, and started on 25 mg spironolactone per cardiology.  Consider restarting home lisinopril and Jardiance for goal-directed medical therapy.  [facility]  Patient was hemodynamically stable to discharge to SNF.  Pulmonary HTN: Extensive work-up included TEE and heart cath.  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  Pulmonology recommended BiPAP for suspected OSA/obesity hypoventilation syndrome.  Patient's breathing continued to improve, and she tolerates BiPAP well.  Pulm/cards recommends outpatient sleep study.  Patient on room air at time of discharge.   Afib: Patient with pacemaker.  Patient was switched from Coumadin to heparin, for possible surgical intervention.  DOAC was considered however patient is unable to afford this medication.  Warfarin was restarted.  Rate was controlled with metoprolol 25 mg twice daily.  Patient was asymptomatic and stable at time of discharge.  Vomiting: Patient with sudden onset dysphagia and associated vomiting prior to hospitalization. Upon admission, patient with persistent pharyngeal pain and  diffusely tender abdomen. CT abdomen negative for acute etiology. GI consulted and did not recommend  intervention but continue PPI therapy. Patient passed SLP eval, started on regular diet with thin liquid. Symptoms improved throughout hospital course.  Resolved and asymptomatic at time of discharge.  Follow-up Recommendations: Start back home warfarin 08/19/2022 Monitor potassium, recheck BMP in 1 week Recommend ambulatory pulse ox to assess for home oxygen need Ensure patient has appropriate pulmonology and cardiology follow-up Needs formal outpatient sleep study for OSA  Work towards optimizing goal-directed therapy for CHF   Discharge Diagnoses/Problem List:   Acute respiratory failure with hypoxia ([place])   Pulmonary hypertension, primary ([place])   A-fib ([place])   CHF (congestive heart failure) ([place])   Extremity pain   Diabetes ([place])   Pulmonary hypertension, unspecified ([place])   Pneumonia of both lower lobes due to infectious organism  Disposition: SNF  Discharge Condition: Stable  Discharge Exam: Per my progress note General: Nonacute distress Cardiovascular: Irregular rate and rhythm. Respiratory: CTAB, normal work of breathing on room air Extremities: No edema, warm and dry  Significant Procedures: Echo TEE, cardiac catheterization  Significant Labs and Imaging:  Recent Labs  Lab 08/17/22 0435 08/18/22 0210  WBC 8.5 7.8  HGB 10.9* 10.7*  HCT 33.2* 32.2*  PLT 381 357   Recent Labs  Lab 08/17/22 0435 08/18/22 0210  NA 134* 132*  K 3.7 3.7  CL 92* 95*  CO2 28 27  GLUCOSE 167* 235*  BUN 35* 35*  CREATININE 1.22* 1.04*  CALCIUM 10.0 9.4    Results/Tests Pending at Time of Discharge: None   Imaging: ECHOCARDIOGRAM LIMITED BUBBLE STUDY Result Date: 08/13/2022  IMPRESSIONS  1. Left ventricular ejection fraction, by estimation, is 55%. The left ventricle has no regional wall motion abnormalities. Left ventricular diastolic parameters are indeterminate.  2.  D-shaped septum suggesting RV pressure/volume overload. Right ventricular systolic function is mildly reduced. The right ventricular size is moderately enlarged. Tricuspid regurgitation signal is inadequate for assessing PA pressure.  3. Left atrial size was moderately dilated.  4. Right atrial size was moderately dilated.  5. Negative bubble study, no evidence for PFO or ASD.  6. The patient was in atrial fibrillation.  7. Limited echo with no doppler.   CARDIAC CATHETERIZATIO Result Date: 08/12/2022 Findings: RA = 13 RV = 48/15 PA = 50/26 (35) PCW = 14 Fick cardiac output/index = 5.9/2.8 PVR = 3.6 WU Ao sat = 99% PA sat = 69%, 70% RA sat = 69% SVC sat = 60%, 61% IVC sat = 60% Qp/Qs = 1.27 Assessment: 1. Mild to moderate PAH with normal cardiac output and left-sided filling pressures 2. 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 Will need CPAP/BIPAP on d/c.  Continue diuresis one more day. Will check echo with bubble study. [name], MD 11:56 AM  DG CHEST PORT 1 VIEW Result Date: 08/12/2022 CLINICAL DATA:  [id number].  Chest pain and dysphagia. EXAM: PORTABLE CHEST 1 VIEW COMPARISON:  Portable chest 08/09/2022 at 11:38 a.m.  IMPRESSION: 1. Improved opacities in the left lower lung field with stable right infrahilar hazy opacity. 2. Cardiomegaly with central vascular prominence without overt edema. Electronically Signed   By: Telford Nab M.D.   On: 08/12/2022 07:37   VAS [place] UPPER EXTREMITY VENOUS DUPLEX Result Date: 08/10/2022 NO DVT  VAS [place] LOWER EXTREMITY VENOUS (DVT) Result Date: 08/10/2022 NO DVT  DG CHEST PORT 1 VIEW Result Date: 08/09/2022 CLINICAL DATA:  Short of breath. EXAM: PORTABLE CHEST 1 VIEW COMPARISON:  08/07/2022 and older exams.  CT, 08/08/2022. FINDINGS:  IMPRESSION: 1. Improved lung base opacities compared to the prior exams, likely improved atelectasis. 2. No new abnormalities.  No overt pulmonary edema. Electronically  Signed   By: Lajean Manes M.D.   On: 08/09/2022 13:34   CT Angio Chest Pulmonary Embolism (PE) W or WO Contrast Result Date: 08/08/2022 IMPRESSION: 1. No pulmonary embolism. 2. Morphologic changes in keeping with pulmonary arterial hypertension and at least some degree of right heart failure. Mild global cardiomegaly. Mild coronary artery calcification. 3. Dense collapse and consolidation of the basal segments of the lower lobes bilaterally. Significant volume loss suggests this largely represents atelectasis, however, airspace infiltrate is identified, particularly within the left lower lobe and a superimposed infectious process is difficult to exclude. 4. Mosaic attenuation within the aerated portion of the lungs bilaterally suggesting multifocal air trapping related to small airways disease. Aortic Atherosclerosis (ICD10-I70.0). Electronically Signed   By: Fidela Salisbury M.D.   On: 08/08/2022 00:45   DG CHEST PORT 1 VIEW  Result Date: 08/07/2022 CLINICAL DATA:  Shortness of breath EXAM: PORTABLE CHEST 1 VIEW IMPRESSION: New infiltrates in the right lower lung favored to represent edema though infection is not excluded. Cardiomegaly and pulmonary vascular congestion. Electronically Signed   By: Placido Sou M.D.   On: 08/07/2022 23:03   ECHOCARDIOGRAM COMPLETE Result Date: 08/07/2022 IMPRESSIONS  1. Left ventricular ejection fraction, by estimation, is 55 to 60%. The left ventricle has normal function. The left ventricle has no regional wall motion abnormalities. Left ventricular diastolic parameters are indeterminate.  2. Right ventricular systolic function is normal. The right ventricular size is mildly enlarged. There is severely elevated pulmonary artery systolic pressure. The estimated right ventricular systolic pressure is 81.1 mmHg.  3. Left atrial size was moderately dilated.  4. Right atrial size was moderately dilated.  5. The mitral valve is grossly normal. No evidence of mitral valve  regurgitation.  6. Appears RV lead is impacting coaptation of the tricuspid valve leaflets. Tricuspid valve regurgitation is severe.  7. The aortic valve is tricuspid. Aortic valve regurgitation is not visualized.  8. The inferior vena cava is dilated in size with <50% respiratory variability, suggesting right atrial pressure of 15 mmHg.  CT ABDOMEN PELVIS W CONTRAST Result Date: 08/07/2022 IMPRESSION: There is no evidence of intestinal obstruction or pneumoperitoneum. There is no hydronephrosis. Left kidney is much smaller than right which may be a congenital variation or suggest scarring from chronic pyelonephritis or renal artery stenosis. There are patchy infiltrates in both lower lung fields suggesting atelectasis/pneumonia. Cystocele in urinary bladder. Degenerative changes are noted in right hip. Other findings as described in the body of the report. Electronically Signed   By: Elmer Picker M.D.   On: 08/07/2022 14:51   CT Soft Tissue Neck W Contrast Result Date: 08/06/2022 IMPRESSION: 1. Narrowing of the left vallecula, which is nonspecific. Recommend direct visualization. 2. No additional acute process in the neck. Electronically Signed   By: Merilyn Baba M.D.   On: 08/06/2022 23:23   DG Chest 2 View Result Date: 08/06/2022 IMPRESSION: Cardiomegaly with vascular congestion Electronically Signed   By: Donavan Foil M.D.   On: 08/06/2022 21:47     Discharge Medications:  Allergies as of 08/18/2022       Reactions   Versed [midazolam] Hives   Tylenol With Codeine #3 [acetaminophen-codeine] Palpitations        Medication List     STOP taking these medications    amLODipine 10 MG tablet Commonly known as:  NORVASC   furosemide 20 MG tablet Commonly known as: LASIX   magnesium oxide 400 (241.3 Mg) MG tablet Commonly known as: MAG-OX   potassium chloride SA 20 MEQ tablet Commonly known as: KLOR-CON M       TAKE these medications    acetaminophen 500 MG  tablet Commonly known as: TYLENOL Take 1 tablet (500 mg total) by mouth every 6 (six) hours as needed.   albuterol 108 (90 Base) MCG/ACT inhaler Commonly known as: VENTOLIN HFA Inhale 2 puffs into the lungs every 2 (two) hours as needed for wheezing or shortness of breath.   aspirin EC 81 MG tablet Take 81 mg by mouth daily.   atorvastatin 80 MG tablet Commonly known as: LIPITOR Take 80 mg by mouth daily.   diclofenac Sodium 1 % Gel Commonly known as: VOLTAREN Apply 2 g topically 4 (four) times daily.   gabapentin 100 MG capsule Commonly known as: NEURONTIN Take 2 capsules (200 mg total) by mouth 3 (three) times daily.   Jardiance 25 MG Tabs tablet Generic drug: empagliflozin Take 25 mg by mouth daily.   latanoprost 0.005 % ophthalmic solution Commonly known as: XALATAN Place 1 drop into both eyes at bedtime.   lisinopril 40 MG tablet Commonly known as: ZESTRIL Take 40 mg by mouth daily.   metFORMIN 500 MG tablet Commonly known as: GLUCOPHAGE Take 1,000 mg by mouth daily with breakfast.   metoprolol tartrate 25 MG tablet Commonly known as: LOPRESSOR Take 25 mg by mouth 2 (two) times daily.   spironolactone 25 MG tablet Commonly known as: ALDACTONE Take 1 tablet (25 mg total) by mouth daily. Start taking on: August 19, 2022   torsemide 20 MG tablet Commonly known as: DEMADEX Take 1 tablet (20 mg total) by mouth daily. Start taking on: August 19, 2022   warfarin 5 MG tablet Commonly known as: COUMADIN Take 1 tablet (5 mg total) by mouth See admin instructions. Take 5 mg by mouth daily except take 7.5 mg on Sunday, Monday, and Thursday. Start taking on: August 19, 2022        Discharge Instructions: Please refer to Patient Instructions section of EMR for full details.  Patient was counseled important signs and symptoms that should prompt return to medical care, changes in medications, dietary instructions, activity restrictions, and follow up  appointments.   Follow-Up Appointments:  Contact information for after-discharge care     Destination     [facility] Preferred SNF .   Service: Skilled Nursing Contact information: [address] [place], [place], [place] 08/18/2022, 1:04 PM PGY-1, [facility]   Upper Level Addendum:  I have seen and evaluated this patient along with Dr. Nelda Bucks and reviewed the above note, making necessary revisions as appropriate.  I agree with the medical decision making and physical exam as noted above.  Gerrit Heck, MD PGY-2 [facility]

## 2022-08-18 NOTE — TOC Progression Note (Addendum)
Transition of Care [facility]) - Progression Note    Patient Details  Name: Candice Mclean MRN: [id number] Date of Birth: 1946/08/17  Transition [facility]) CM/SW [place], LCSW Phone Number: 08/18/2022, 9:49 AM  Clinical Narrative:    [place] able to accept patient today. CSW spoke with patient's son and he is in agreement with [facility] for transport.    Expected Discharge Plan: Skilled Nursing Facility Barriers to Discharge: Barriers Resolved  Expected Discharge Plan and Services Expected Discharge Plan: [place] In-house Referral: Clinical Social Work Discharge Planning Services: CM Consult Post Acute Care Choice: [place] Living arrangements for the past 2 months: [place]                   DME Agency: [employer] Date DME Agency Contacted: 08/12/22 Time DME Agency Contacted: 1501 Representative spoke with at DME Agency: Thedore Mins             Social Determinants of Health ([place]) Interventions    Readmission Risk Interventions     No data to display

## 2022-08-18 NOTE — Progress Notes (Addendum)
Daily Progress Note Intern Pager: [phone]  Patient name: Candice Mclean Medical record number: [id number] Date of birth: 1946-09-25 Age: 76 y.o. 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  She continues to improve, and is hemodynamically stable.  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  Pneumonia was treated with full antibiotic course, and infiltrates improved on repeat x-ray.  No surgical intervention for CHF/PAH at this time.  Patient was weaned from BiPAP, will follow-up outpatient for sleep study. Includes first-degree AVB, thyroid nodule, asthma, renal artery embolism, syncope, PAF, SSS status post [employer]tronic dual-chamber pacemaker.  * Acute respiratory failure with hypoxia [facility]) Patient is on room air.  Not requiring oxygen at this time.  We will evaluate for home oxygen need. 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  Recommended outpatient full PFT evaluation per  pulmonology. -Follow-up with outpatient for OSA  A-fib ([place]) Irregular rate and rhythm on exam. INR 1.5 10/9 - Warfarin bridged with heparin - Home metoprolol 80m BID  CHF (congestive heart failure) ([place] down 9.8 L. Weight stable 116.1 kg -Continue spironolactone 25 mg daily - P.o. torsemide 20 mg daily  Extremity pain - 200 mg gabapentin 3 times daily  Diabetes ([facility]) Blood glucose levels remain mostly within goal. -sSSI -CBG q6   FEN/GI: Regular PPx: Warfarin, heparin bridge Dispo:SNF  pending bed offer .   Subjective:  Patient reports that she is feeling much better, breathing on room air.  Is able to ambulate from bed to bathroom with some assistance.  No other acute concerns today.  Objective: Temp:  [97.7 F (36.5 C)-98.8 F (37.1 C)] 98 F (36.7 C) (10/09 0826) Pulse Rate:  [78-93] 80 (10/09 0826) Resp:  [17-18] 17 (10/09 0826) BP: (112-146)/(71-83) 146/83 (10/09 0826) SpO2:  [94 %-96 %] 94 % (10/09 0826) Physical Exam: General: Not in acute distress Cardiovascular: Regular rate and rhythm Respiratory: CTAB, normal work of breathing on room air Extremities: No edema, warm and dry  Laboratory: Most recent CBC Lab Results  Component Value Date   WBC 7.8 08/18/2022   HGB 10.7 (L) 08/18/2022   HCT 32.2 (L) 08/18/2022   MCV 81.3 08/18/2022   PLT 357 08/18/2022   Most recent BMP    Latest Ref Rng & Units 08/18/2022    2:10 AM  BMP  Glucose 70 - 99 mg/dL 235   BUN 8 - 23 mg/dL 35   Creatinine 0.44 - 1.00 mg/dL 1.04   Sodium 135 - 145 mmol/L 132   Potassium 3.5 - 5.1 mmol/L 3.7   Chloride 98 - 111 mmol/L 95   CO2 22 -  32 mmol/L 27   Calcium 8.9 - 10.3 mg/dL 9.4     Candice Dales, DO 08/18/2022, 9:41 AM  PGY-1, [place] Intern pager: [phone], text pages welcome Secure chat group [place]

## 2022-08-18 NOTE — Care Management Important Message (Signed)
Important Message  Patient Details  Name: Candice Mclean MRN: [id number] Date of Birth: 1946-10-22   Medicare Important Message Given:  Yes     Breelle Hollywood [address] 08/18/2022, 3:23 PM

## 2022-08-18 NOTE — Progress Notes (Signed)
[place] for heparin>>warfarin bridge Indication: atrial fibrillation  Allergies  Allergen Reactions   Versed [Midazolam] Hives   Tylenol With Codeine #3 [Acetaminophen-Codeine] Palpitations    Patient Measurements: Height: _0  (157.5 cm) Weight: 116.1 kg (255 lb 15.3 oz) IBW/kg (Calculated) : 50.1kg Heparin Dosing Weight: 78.7 kg   Vital Signs: Temp: 97.7 F (36.5 C) (10/09 0513) Temp Source: Oral (10/09 0513) BP: 125/78 (10/09 0513) Pulse Rate: 78 (10/09 0513)  Labs: Recent Labs    08/16/22 0315 08/17/22 0435 08/18/22 0210  HGB 10.8* 10.9* 10.7*  HCT 32.1* 33.2* 32.2*  PLT 338 381 357  LABPROT 18.9* 18.2* 17.6*  INR 1.6* 1.5* 1.5*  HEPARINUNFRC 0.60 0.53 0.86*  CREATININE  --  1.22* 1.04*    Estimated Creatinine Clearance: 58.2 mL/min (A) (by C-G formula based on SCr of 1.04 mg/dL (H)).  Medications:  Infusions:   heparin 1,250 Units/hr (08/18/22 0730)    Assessment: 76 YO female presenting 9/28 with sudden onset dysphagia, associated vomiting, and subsequently developed SOB. Patient has a history of afib with PTA warfarin regimen of 7.5 mg on Sun/Mon/Thurs and 5 mg on all other days--no doses since admission. Patient's INR was 2.5 on admission and heparin was started 10/1 after INR < 2. Patient is now transitioning back to warfarin in anticipation of discharge. Discussed possibility of DOAC over warfarin and patient was not interested given the high copay.   Heparin level is supratherapeutic at 0.86, on 1400 units/hr. Hgb stable 10-11s, platelets are normal. INR is subtherapeutic at 1.5 today after 3 doses of warfarin 7.5 mg. Expect INR to start increasing in the next few days. No line issues or signs/symptoms of bleeding noted per discussion with RN.    Plan to stop bridging once the patient's INR is therapeutic or when she discharges (whichever comes first) given her indication of afib per primary team.   Goal of  Therapy:  Heparin level 0.3-0.7 units/ml INR goal 2-3  Monitor platelets by anticoagulation protocol: Yes   Plan:  Decrease heparin drip to 1250 units/hr Warfarin 5 mg PO tonight  8h heparin level Monitor heparin level, INR, CBC, and s/sx of bleeding daily   Thank you for involving pharmacy in this patient's care.  Renold Genta, PharmD, BCPS Clinical Pharmacist Clinical phone for 08/18/2022 until 3p is [id number] 08/18/2022 7:53 AM

## 2022-08-18 NOTE — Progress Notes (Signed)
Discharge report called to the facility per Whitman Hero; given to [facility], LPN.  [facility] to transport patient to facility.  AVS placed in discharge packet.

## 2022-08-18 NOTE — Progress Notes (Signed)
Spoke with infection control > will d/c droplet precautions as she has improved clinically.
